# Patient Record
Sex: Male | Born: 1960 | Race: White | Hispanic: No | Marital: Single | Smoking: Never smoker
Health system: Southern US, Community
[De-identification: ages and names within clinical notes are randomized; demographics above are authoritative.]

## PROBLEM LIST (undated history)

## (undated) DIAGNOSIS — R569 Unspecified convulsions: Secondary | ICD-10-CM

## (undated) DIAGNOSIS — C801 Malignant (primary) neoplasm, unspecified: Secondary | ICD-10-CM

## (undated) DIAGNOSIS — J302 Other seasonal allergic rhinitis: Secondary | ICD-10-CM

## (undated) DIAGNOSIS — I10 Essential (primary) hypertension: Secondary | ICD-10-CM

## (undated) DIAGNOSIS — K219 Gastro-esophageal reflux disease without esophagitis: Secondary | ICD-10-CM

## (undated) DIAGNOSIS — IMO0001 Reserved for inherently not codable concepts without codable children: Secondary | ICD-10-CM

## (undated) DIAGNOSIS — M1990 Unspecified osteoarthritis, unspecified site: Secondary | ICD-10-CM

## (undated) DIAGNOSIS — J45909 Unspecified asthma, uncomplicated: Secondary | ICD-10-CM

## (undated) DIAGNOSIS — F419 Anxiety disorder, unspecified: Secondary | ICD-10-CM

## (undated) HISTORY — PX: CARPAL TUNNEL RELEASE: SHX101

## (undated) HISTORY — PX: COLONOSCOPY W/ POLYPECTOMY: SHX1380

---

## 1999-02-21 ENCOUNTER — Emergency Department (HOSPITAL_COMMUNITY): Admission: EM | Admit: 1999-02-21 | Discharge: 1999-02-21 | Payer: Self-pay | Admitting: Emergency Medicine

## 1999-02-21 ENCOUNTER — Encounter: Payer: Self-pay | Admitting: Emergency Medicine

## 2001-11-24 ENCOUNTER — Ambulatory Visit (HOSPITAL_BASED_OUTPATIENT_CLINIC_OR_DEPARTMENT_OTHER): Admission: RE | Admit: 2001-11-24 | Discharge: 2001-11-24 | Payer: Self-pay | Admitting: *Deleted

## 2014-08-09 ENCOUNTER — Encounter (HOSPITAL_COMMUNITY): Payer: Self-pay | Admitting: Emergency Medicine

## 2014-08-09 ENCOUNTER — Emergency Department (HOSPITAL_COMMUNITY)
Admission: EM | Admit: 2014-08-09 | Discharge: 2014-08-09 | Disposition: A | Discharge status: Home / Self Care | Payer: BLUE CROSS/BLUE SHIELD | Attending: Emergency Medicine | Admitting: Emergency Medicine

## 2014-08-09 ENCOUNTER — Emergency Department (HOSPITAL_COMMUNITY): Payer: BLUE CROSS/BLUE SHIELD

## 2014-08-09 DIAGNOSIS — Y9289 Other specified places as the place of occurrence of the external cause: Secondary | ICD-10-CM | POA: Diagnosis not present

## 2014-08-09 DIAGNOSIS — Z79899 Other long term (current) drug therapy: Secondary | ICD-10-CM | POA: Insufficient documentation

## 2014-08-09 DIAGNOSIS — Z7982 Long term (current) use of aspirin: Secondary | ICD-10-CM | POA: Diagnosis not present

## 2014-08-09 DIAGNOSIS — Y9389 Activity, other specified: Secondary | ICD-10-CM | POA: Diagnosis not present

## 2014-08-09 DIAGNOSIS — W108XXA Fall (on) (from) other stairs and steps, initial encounter: Secondary | ICD-10-CM | POA: Diagnosis not present

## 2014-08-09 DIAGNOSIS — S99912A Unspecified injury of left ankle, initial encounter: Secondary | ICD-10-CM | POA: Diagnosis present

## 2014-08-09 DIAGNOSIS — S82852A Displaced trimalleolar fracture of left lower leg, initial encounter for closed fracture: Secondary | ICD-10-CM | POA: Diagnosis not present

## 2014-08-09 DIAGNOSIS — Y998 Other external cause status: Secondary | ICD-10-CM | POA: Diagnosis not present

## 2014-08-09 DIAGNOSIS — G40909 Epilepsy, unspecified, not intractable, without status epilepticus: Secondary | ICD-10-CM | POA: Diagnosis not present

## 2014-08-09 DIAGNOSIS — J45909 Unspecified asthma, uncomplicated: Secondary | ICD-10-CM | POA: Diagnosis not present

## 2014-08-09 HISTORY — DX: Unspecified asthma, uncomplicated: J45.909

## 2014-08-09 HISTORY — DX: Unspecified convulsions: R56.9

## 2014-08-09 MED ORDER — HYDROCODONE-ACETAMINOPHEN 5-325 MG PO TABS: 1.0000 | ORAL_TABLET | Freq: Four times a day (QID) | ORAL | Status: DC | PRN

## 2014-08-09 NOTE — ED Provider Notes (Addendum)
CSN: [id number]     Arrival date & time 08/09/14  1443 History   First MD Initiated Contact with Patient 08/09/14 1504     Chief Complaint  Patient presents with  . Fall  . Ankle Injury    Left     (Consider location/radiation/quality/duration/timing/severity/associated sxs/prior Treatment) Patient is a 54 y.o. male presenting with fall and lower extremity injury. The history is provided by the patient.  Fall This is a new (walking down stairs with a table leaf and missed a step falling down 4 steps) problem. The current episode started 1 to 2 hours ago. The problem occurs constantly. The problem has not changed since onset.Associated symptoms comments: No head injury, LOC, back pain or abd pain.  Severe left ankle pain and deformity. The symptoms are aggravated by bending, twisting and standing. The symptoms are relieved by ice and rest. He has tried rest for the symptoms. The treatment provided no relief.  Ankle Injury    Past Medical History  Diagnosis Date  . Seizures   . Asthma    Past Surgical History  Procedure Laterality Date  . Carpal tunnel release     History reviewed. No pertinent family history. History  Substance Use Topics  . Smoking status: Not on file  . Smokeless tobacco: Not on file  . Alcohol Use: Not on file    Review of Systems  Neurological: Negative for weakness and numbness.  All other systems reviewed and are negative.     Allergies  Prednisone  Home Medications   Prior to Admission medications   Medication Sig Start Date End Date Taking? Authorizing Provider  aspirin 325 MG tablet Take 325 mg by mouth daily as needed for mild pain.   Yes Historical Provider, MD  clonazePAM (KLONOPIN) 1 MG tablet Take 1 mg by mouth at bedtime. 07/05/14  Yes Historical Provider, MD  ibuprofen (ADVIL,MOTRIN) 200 MG tablet Take 200 mg by mouth every 6 (six) hours as needed (pain.).   Yes Historical Provider, MD  lamoTRIgine (LAMICTAL) 100 MG tablet Take 100  mg by mouth daily. 06/07/14  Yes Historical Provider, MD  PROAIR HFA 108 (90 BASE) MCG/ACT inhaler Take 1 puff by mouth 2 (two) times daily as needed for wheezing or shortness of breath.  07/09/14  Yes Historical Provider, MD  traZODone (DESYREL) 150 MG tablet Take 225 mg by mouth at bedtime. 06/20/14  Yes Historical Provider, MD   BP 175/86 mmHg  Pulse 98  Temp(Src) 98.6 F (37 C) (Oral)  Resp 18  SpO2 97% Physical Exam  Constitutional: He is oriented to person, place, and time. He appears well-developed and well-nourished. No distress.  HENT:  Head: Normocephalic and atraumatic.  Mouth/Throat: Oropharynx is clear and moist.  Eyes: Conjunctivae and EOM are normal. Pupils are equal, round, and reactive to light.  Neck: Normal range of motion. Neck supple.  Cardiovascular: Normal rate and intact distal pulses.   Pulmonary/Chest: Effort normal.  Musculoskeletal: He exhibits no edema.       Left ankle: He exhibits decreased range of motion, swelling, ecchymosis and deformity. Tenderness. Lateral malleolus and medial malleolus tenderness found. No head of 5th metatarsal and no proximal fibula tenderness found. Achilles tendon normal.  Neurological: He is alert and oriented to person, place, and time.  Skin: Skin is warm and dry. No rash noted. No erythema.  Psychiatric: He has a normal mood and affect. His behavior is normal.  Nursing note and vitals reviewed.   ED Course  Procedures (  including critical care time) Labs Review Labs Reviewed - No data to display  Imaging Review Dg Ankle Complete Left  08/09/2014   CLINICAL DATA:  Left ankle pain after a fall down steps today. Swelling. Initial encounter.  EXAM: LEFT ANKLE COMPLETE - 3+ VIEW  COMPARISON:  None.  FINDINGS: The patient has medial and posterior malleolar fractures. As fracture of the distal fibula extends from the lateral malleolus 6.2 cm cephalad into the diaphysis. The medial clear space is widened. Marked soft tissue swelling  is present about the ankle.  IMPRESSION: Acute trimalleolar fractures as described   Electronically Signed   By: Inge Rise M.D.   On: 08/09/2014 15:50     EKG Interpretation None      MDM   Final diagnoses:  Trimalleolar fracture, left, closed, initial encounter    Patient with a mechanical fall down the steps to takes no anticoagulation presents with a left ankle deformity and pain. No other injuries from the fall. Patient has normal sensation and pulse in the left foot. X-rays show an acute trimalleolar fracture.  Spoke with Dr. Erlinda Hong with [facility] orthopedics patient will follow-up tomorrow. He also requested CT. He was placed in a splint and put on crutches nonweightbearing    Blanchie Dessert, MD 08/09/14 Robertson, MD 08/09/14 1658

## 2014-08-09 NOTE — Discharge Instructions (Signed)
Ankle Fracture  A fracture is a break in a bone. The ankle joint is made up of three bones. These include the lower (distal)sections of your lower leg bones, called the tibia and fibula, along with a bone in your foot, called the talus. Depending on how bad the break is and if more than one ankle joint bone is broken, a cast or splint is used to protect and keep your injured bone from moving while it heals. Sometimes, surgery is required to help the fracture heal properly.   There are two general types of fractures:   Stable fracture. This includes a single fracture line through one bone, with no injury to ankle ligaments. A fracture of the talus that does not have any displacement (movement of the bone on either side of the fracture line) is also stable.   Unstable fracture. This includes more than one fracture line through one or more bones in the ankle joint. It also includes fractures that have displacement of the bone on either side of the fracture line.  CAUSES   A direct blow to the ankle.    Quickly and severely twisting your ankle.   Trauma, such as a car accident or falling from a significant height.  RISK FACTORS  You may be at a higher risk of ankle fracture if:   You have certain medical conditions.   You are involved in high-impact sports.   You are involved in a high-impact car accident.  SIGNS AND SYMPTOMS    Tender and swollen ankle.   Bruising around the injured ankle.   Pain on movement of the ankle.   Difficulty walking or putting weight on the ankle.   A cold foot below the site of the ankle injury. This can occur if the blood vessels passing through your injured ankle were also damaged.   Numbness in the foot below the site of the ankle injury.  DIAGNOSIS   An ankle fracture is usually diagnosed with a physical exam and X-rays. A CT scan may also be required for complex fractures.  TREATMENT   Stable fractures are treated with a cast or splint and using crutches to avoid putting  weight on your injured ankle. This is followed by an ankle strengthening program. Some patients require a special type of cast, depending on other medical problems they may have. Unstable fractures require surgery to ensure the bones heal properly. Your health care provider will tell you what type of fracture you have and the best treatment for your condition.  HOME CARE INSTRUCTIONS    Review correct crutch use with your health care provider and use your crutches as directed. Safe use of crutches is extremely important. Misuse of crutches can cause you to fall or cause injury to nerves in your hands or armpits.   Do not put weight or pressure on the injured ankle until directed by your health care provider.   To lessen the swelling, keep the injured leg elevated while sitting or lying down.   Apply ice to the injured area:   Put ice in a plastic bag.   Place a towel between your cast and the bag.   Leave the ice on for 20 minutes, 2-3 times a day.   If you have a plaster or fiberglass cast:   Do not try to scratch the skin under the cast with any objects. This can increase your risk of skin infection.   Check the skin around the cast every day. You   may put lotion on any red or sore areas.   Keep your cast dry and clean.   If you have a plaster splint:   Wear the splint as directed.   You may loosen the elastic around the splint if your toes become numb, tingle, or turn cold or blue.   Do not put pressure on any part of your cast or splint; it may break. Rest your cast only on a pillow the first 24 hours until it is fully hardened.   Your cast or splint can be protected during bathing with a plastic bag sealed to your skin with medical tape. Do not lower the cast or splint into water.   Take medicines as directed by your health care provider. Only take over-the-counter or prescription medicines for pain, discomfort, or fever as directed by your health care provider.   Do not drive a vehicle until  your health care provider specifically tells you it is safe to do so.   If your health care provider has given you a follow-up appointment, it is very important to keep that appointment. Not keeping the appointment could result in a chronic or permanent injury, pain, and disability. If you have any problem keeping the appointment, call the facility for assistance.  SEEK MEDICAL CARE IF:  You develop increased swelling or discomfort.  SEEK IMMEDIATE MEDICAL CARE IF:    Your cast gets damaged or breaks.   You have continued severe pain.   You develop new pain or swelling after the cast was put on.   Your skin or toenails below the injury turn blue or gray.   Your skin or toenails below the injury feel cold, numb, or have loss of sensitivity to touch.   There is a bad smell or pus draining from under the cast.  MAKE SURE YOU:    Understand these instructions.   Will watch your condition.   Will get help right away if you are not doing well or get worse.  Document Released: 05/08/2000 Document Revised: 05/16/2013 Document Reviewed: 12/08/2012  [employer] Patient Information 2015 [employer]. This information is not intended to replace advice given to you by your health care provider. Make sure you discuss any questions you have with your health care provider.

## 2014-08-09 NOTE — ED Notes (Signed)
Pt carrying large item down stairs, slipped around 4 steps up from the floor. Fell directly onto left ankle, per EMS large amount of bruising and swelling, no open wounds, possible deformity visualized.

## 2014-08-09 NOTE — ED Notes (Signed)
Bed: FV49 Expected date:  Expected time:  Means of arrival:  Comments: Fall, ankle deformity

## 2014-08-10 ENCOUNTER — Encounter (HOSPITAL_COMMUNITY): Payer: Self-pay | Admitting: *Deleted

## 2014-08-10 ENCOUNTER — Other Ambulatory Visit (HOSPITAL_COMMUNITY): Payer: Self-pay | Admitting: Orthopaedic Surgery

## 2014-08-10 NOTE — Progress Notes (Signed)
Mr Cadieux stated that Dr Erlinda Hong said he could eat a bandana and drink something before 0700, NPO after that.

## 2014-08-12 MED ORDER — CEFAZOLIN SODIUM-DEXTROSE 2-3 GM-% IV SOLR: 2.0000 g | INTRAVENOUS | Status: AC

## 2014-08-13 ENCOUNTER — Ambulatory Visit (HOSPITAL_COMMUNITY)
Admission: RE | Admit: 2014-08-13 | Discharge: 2014-08-13 | Disposition: A | Discharge status: Home / Self Care | Payer: BLUE CROSS/BLUE SHIELD | Source: Ambulatory Visit | Attending: Orthopaedic Surgery | Admitting: Orthopaedic Surgery

## 2014-08-13 ENCOUNTER — Ambulatory Visit (HOSPITAL_COMMUNITY): Payer: BLUE CROSS/BLUE SHIELD

## 2014-08-13 ENCOUNTER — Ambulatory Visit (HOSPITAL_COMMUNITY): Payer: BLUE CROSS/BLUE SHIELD | Admitting: Anesthesiology

## 2014-08-13 ENCOUNTER — Encounter (HOSPITAL_COMMUNITY)
Admission: RE | Disposition: A | Discharge status: Home / Self Care | Payer: Self-pay | Source: Ambulatory Visit | Attending: Orthopaedic Surgery

## 2014-08-13 ENCOUNTER — Encounter (HOSPITAL_COMMUNITY): Payer: Self-pay | Admitting: *Deleted

## 2014-08-13 ENCOUNTER — Other Ambulatory Visit: Payer: Self-pay

## 2014-08-13 DIAGNOSIS — Z6838 Body mass index (BMI) 38.0-38.9, adult: Secondary | ICD-10-CM | POA: Diagnosis not present

## 2014-08-13 DIAGNOSIS — Z85828 Personal history of other malignant neoplasm of skin: Secondary | ICD-10-CM | POA: Diagnosis not present

## 2014-08-13 DIAGNOSIS — S82852A Displaced trimalleolar fracture of left lower leg, initial encounter for closed fracture: Secondary | ICD-10-CM | POA: Insufficient documentation

## 2014-08-13 DIAGNOSIS — X58XXXA Exposure to other specified factors, initial encounter: Secondary | ICD-10-CM | POA: Diagnosis not present

## 2014-08-13 DIAGNOSIS — Z888 Allergy status to other drugs, medicaments and biological substances status: Secondary | ICD-10-CM | POA: Insufficient documentation

## 2014-08-13 DIAGNOSIS — F1099 Alcohol use, unspecified with unspecified alcohol-induced disorder: Secondary | ICD-10-CM | POA: Insufficient documentation

## 2014-08-13 DIAGNOSIS — Z7982 Long term (current) use of aspirin: Secondary | ICD-10-CM | POA: Diagnosis not present

## 2014-08-13 DIAGNOSIS — F419 Anxiety disorder, unspecified: Secondary | ICD-10-CM | POA: Diagnosis not present

## 2014-08-13 DIAGNOSIS — Z419 Encounter for procedure for purposes other than remedying health state, unspecified: Secondary | ICD-10-CM

## 2014-08-13 DIAGNOSIS — F1210 Cannabis abuse, uncomplicated: Secondary | ICD-10-CM | POA: Insufficient documentation

## 2014-08-13 DIAGNOSIS — K219 Gastro-esophageal reflux disease without esophagitis: Secondary | ICD-10-CM | POA: Diagnosis not present

## 2014-08-13 DIAGNOSIS — J45909 Unspecified asthma, uncomplicated: Secondary | ICD-10-CM | POA: Insufficient documentation

## 2014-08-13 HISTORY — DX: Reserved for inherently not codable concepts without codable children: IMO0001

## 2014-08-13 HISTORY — PX: ORIF ANKLE FRACTURE: SHX5408

## 2014-08-13 HISTORY — DX: Other seasonal allergic rhinitis: J30.2

## 2014-08-13 HISTORY — DX: Anxiety disorder, unspecified: F41.9

## 2014-08-13 HISTORY — DX: Malignant (primary) neoplasm, unspecified: C80.1

## 2014-08-13 HISTORY — DX: Gastro-esophageal reflux disease without esophagitis: K21.9

## 2014-08-13 HISTORY — DX: Unspecified osteoarthritis, unspecified site: M19.90

## 2014-08-13 LAB — CBC
HCT: 43.7 % (ref 39.0–52.0)
Hemoglobin: 15 g/dL (ref 13.0–17.0)
MCH: 31.8 pg (ref 26.0–34.0)
MCHC: 34.3 g/dL (ref 30.0–36.0)
MCV: 92.6 fL (ref 78.0–100.0)
Platelets: 230 10*3/uL (ref 150–400)
RBC: 4.72 MIL/uL (ref 4.22–5.81)
RDW: 13 % (ref 11.5–15.5)
WBC: 8.3 10*3/uL (ref 4.0–10.5)

## 2014-08-13 LAB — BASIC METABOLIC PANEL
Anion gap: 10 (ref 5–15)
BUN: 11 mg/dL (ref 6–23)
CO2: 27 mmol/L (ref 19–32)
Calcium: 9.2 mg/dL (ref 8.4–10.5)
Chloride: 102 mmol/L (ref 96–112)
Creatinine, Ser: 0.81 mg/dL (ref 0.50–1.35)
GFR calc Af Amer: >90 mL/min (ref >90)
GFR calc non Af Amer: >90 mL/min (ref >90)
Glucose, Bld: 100 mg/dL — ABNORMAL HIGH (ref 70–99)
Potassium: 3.9 mmol/L (ref 3.5–5.1)
Sodium: 139 mmol/L (ref 135–145)

## 2014-08-13 LAB — SURGICAL PCR SCREEN
MRSA, PCR: NEGATIVE
Staphylococcus aureus: NEGATIVE

## 2014-08-13 SURGERY — OPEN REDUCTION INTERNAL FIXATION (ORIF) ANKLE FRACTURE
Anesthesia: General | Site: Ankle | Laterality: Left

## 2014-08-13 MED ORDER — PROPOFOL 10 MG/ML IV BOLUS
INTRAVENOUS | Status: AC
  Filled 2014-08-13: qty 20

## 2014-08-13 MED ORDER — LACTATED RINGERS IV SOLN
INTRAVENOUS | Status: DC
  Administered 2014-08-13: 13:00:00 via INTRAVENOUS

## 2014-08-13 MED ORDER — OXYCODONE HCL 5 MG/5ML PO SOLN: 5.0000 mg | Freq: Once | ORAL | Status: DC | PRN

## 2014-08-13 MED ORDER — LACTATED RINGERS IV SOLN
INTRAVENOUS | Status: DC | PRN
  Administered 2014-08-13 (×2): via INTRAVENOUS

## 2014-08-13 MED ORDER — CEFAZOLIN SODIUM-DEXTROSE 2-3 GM-% IV SOLR
INTRAVENOUS | Status: AC
  Administered 2014-08-13: 2 g via INTRAVENOUS
  Filled 2014-08-13: qty 50

## 2014-08-13 MED ORDER — FENTANYL CITRATE 0.05 MG/ML IJ SOLN
INTRAMUSCULAR | Status: AC
  Filled 2014-08-13: qty 5

## 2014-08-13 MED ORDER — MIDAZOLAM HCL 2 MG/2ML IJ SOLN
INTRAMUSCULAR | Status: AC
  Filled 2014-08-13: qty 2

## 2014-08-13 MED ORDER — FENTANYL CITRATE 0.05 MG/ML IJ SOLN
INTRAMUSCULAR | Status: DC | PRN
  Administered 2014-08-13 (×2): 50 ug via INTRAVENOUS
  Administered 2014-08-13: 100 ug via INTRAVENOUS
  Administered 2014-08-13: 50 ug via INTRAVENOUS
  Administered 2014-08-13: 100 ug via INTRAVENOUS

## 2014-08-13 MED ORDER — MUPIROCIN 2 % EX OINT
1.0000 "application " | TOPICAL_OINTMENT | Freq: Once | CUTANEOUS | Status: AC
  Administered 2014-08-13: 1 via TOPICAL
  Filled 2014-08-13: qty 22

## 2014-08-13 MED ORDER — PROPOFOL 10 MG/ML IV BOLUS
INTRAVENOUS | Status: DC | PRN
  Administered 2014-08-13: 200 mg via INTRAVENOUS
  Administered 2014-08-13: 90 mg via INTRAVENOUS

## 2014-08-13 MED ORDER — PROMETHAZINE HCL 25 MG/ML IJ SOLN: 6.2500 mg | INTRAMUSCULAR | Status: DC | PRN

## 2014-08-13 MED ORDER — OXYCODONE HCL 5 MG PO TABS: 5.0000 mg | ORAL_TABLET | Freq: Once | ORAL | Status: DC | PRN

## 2014-08-13 MED ORDER — MIDAZOLAM HCL 5 MG/5ML IJ SOLN
INTRAMUSCULAR | Status: DC | PRN
  Administered 2014-08-13: 2 mg via INTRAVENOUS

## 2014-08-13 MED ORDER — HYDROMORPHONE HCL 1 MG/ML IJ SOLN: 0.2500 mg | INTRAMUSCULAR | Status: DC | PRN

## 2014-08-13 MED ORDER — 0.9 % SODIUM CHLORIDE (POUR BTL) OPTIME
TOPICAL | Status: DC | PRN
  Administered 2014-08-13: 1000 mL

## 2014-08-13 MED ORDER — LIDOCAINE HCL (CARDIAC) 20 MG/ML IV SOLN
INTRAVENOUS | Status: DC | PRN
  Administered 2014-08-13: 100 mg via INTRAVENOUS

## 2014-08-13 MED ORDER — OXYCODONE HCL 5 MG PO TABS: 5.0000 mg | ORAL_TABLET | ORAL | Status: DC | PRN

## 2014-08-13 MED ORDER — BUPIVACAINE-EPINEPHRINE (PF) 0.5% -1:200000 IJ SOLN
INTRAMUSCULAR | Status: DC | PRN
  Administered 2014-08-13: 30 mL via PERINEURAL

## 2014-08-13 MED ORDER — LACTATED RINGERS IV SOLN: INTRAVENOUS | Status: DC

## 2014-08-13 MED ORDER — FENTANYL CITRATE 0.05 MG/ML IJ SOLN
INTRAMUSCULAR | Status: AC
  Filled 2014-08-13: qty 2

## 2014-08-13 MED ORDER — ONDANSETRON HCL 4 MG/2ML IJ SOLN
INTRAMUSCULAR | Status: DC | PRN
  Administered 2014-08-13: 4 mg via INTRAVENOUS

## 2014-08-13 MED ORDER — ASPIRIN EC 325 MG PO TBEC: 325.0000 mg | DELAYED_RELEASE_TABLET | Freq: Two times a day (BID) | ORAL | Status: DC

## 2014-08-13 SURGICAL SUPPLY — 77 items
BANDAGE ELASTIC 4 VELCRO ST LF (GAUZE/BANDAGES/DRESSINGS) ×2 IMPLANT
BANDAGE ELASTIC 6 VELCRO ST LF (GAUZE/BANDAGES/DRESSINGS) ×2 IMPLANT
BANDAGE ESMARK 6X9 LF (GAUZE/BANDAGES/DRESSINGS) ×1 IMPLANT
BIT DRILL CANN 2.7 (BIT) ×2
BIT DRILL CANN 2.7MM (BIT) ×1
BIT DRILL SRG 2.7XCANN AO CPLG (BIT) IMPLANT
BIT DRL SRG 2.7XCANN AO CPLNG (BIT) ×1
BNDG CMPR 9X6 STRL LF SNTH (GAUZE/BANDAGES/DRESSINGS) ×1
BNDG COHESIVE 4X5 TAN STRL (GAUZE/BANDAGES/DRESSINGS) ×3 IMPLANT
BNDG COHESIVE 6X5 TAN STRL LF (GAUZE/BANDAGES/DRESSINGS) ×3 IMPLANT
BNDG ESMARK 6X9 LF (GAUZE/BANDAGES/DRESSINGS) ×3
CANISTER SUCT 3000ML PPV (MISCELLANEOUS) ×3 IMPLANT
COUNTERSINK (MISCELLANEOUS) ×3
COVER SURGICAL LIGHT HANDLE (MISCELLANEOUS) ×3 IMPLANT
CUFF TOURNIQUET SINGLE 34IN LL (TOURNIQUET CUFF) ×2 IMPLANT
CUFF TOURNIQUET SINGLE 44IN (TOURNIQUET CUFF) IMPLANT
DRAPE C-ARM 42X72 X-RAY (DRAPES) ×3 IMPLANT
DRAPE C-ARMOR (DRAPES) ×3 IMPLANT
DRAPE IMP U-DRAPE 54X76 (DRAPES) ×3 IMPLANT
DRAPE INCISE IOBAN 66X45 STRL (DRAPES) ×3 IMPLANT
DRAPE U-SHAPE 47X51 STRL (DRAPES) ×4 IMPLANT
DRILL 2.6X122MM WL AO SHAFT (BIT) ×2 IMPLANT
DURAPREP 26ML APPLICATOR (WOUND CARE) ×5 IMPLANT
ELECT CAUTERY BLADE 6.4 (BLADE) ×3 IMPLANT
ELECT REM PT RETURN 9FT ADLT (ELECTROSURGICAL) ×3
ELECTRODE REM PT RTRN 9FT ADLT (ELECTROSURGICAL) ×1 IMPLANT
FACESHIELD WRAPAROUND (MASK) ×3 IMPLANT
FACESHIELD WRAPAROUND OR TEAM (MASK) ×1 IMPLANT
GAUZE SPONGE 4X4 12PLY STRL (GAUZE/BANDAGES/DRESSINGS) ×3 IMPLANT
GAUZE XEROFORM 5X9 LF (GAUZE/BANDAGES/DRESSINGS) ×3 IMPLANT
GLOVE NEODERM STRL 7.5 LF PF (GLOVE) ×1 IMPLANT
GLOVE SURG NEODERM 7.5  LF PF (GLOVE) ×2
GLOVE SURG SYN 7.5  E (GLOVE) ×2
GLOVE SURG SYN 7.5 E (GLOVE) ×1 IMPLANT
GLOVE SURG SYN 7.5 PF PI (GLOVE) ×2 IMPLANT
GOWN STRL REIN XL XLG (GOWN DISPOSABLE) ×3 IMPLANT
K-WIRE ORTHOPEDIC 1.4X150L (WIRE) ×6
KIT BASIN OR (CUSTOM PROCEDURE TRAY) ×3 IMPLANT
KIT ROOM TURNOVER OR (KITS) ×3 IMPLANT
KWIRE ORTHOPEDIC 1.4X150L (WIRE) IMPLANT
NDL HYPO 25GX1X1/2 BEV (NEEDLE) IMPLANT
NEEDLE HYPO 25GX1X1/2 BEV (NEEDLE) IMPLANT
NS IRRIG 1000ML POUR BTL (IV SOLUTION) ×3 IMPLANT
PACK ORTHO EXTREMITY (CUSTOM PROCEDURE TRAY) ×3 IMPLANT
PAD ABD 8X10 STRL (GAUZE/BANDAGES/DRESSINGS) ×2 IMPLANT
PAD ARMBOARD 7.5X6 YLW CONV (MISCELLANEOUS) ×6 IMPLANT
PAD CAST 3X4 CTTN HI CHSV (CAST SUPPLIES) ×2 IMPLANT
PADDING CAST COTTON 3X4 STRL (CAST SUPPLIES)
PADDING CAST COTTON 6X4 STRL (CAST SUPPLIES) ×3 IMPLANT
PLATE FIBULA 5H (Plate) ×2 IMPLANT
SCREW BONE 14MMX3.5MM (Screw) ×2 IMPLANT
SCREW BONE 18 (Screw) ×2 IMPLANT
SCREW BONE 3.5X16MM (Screw) ×2 IMPLANT
SCREW BONE 3.5X20MM (Screw) ×2 IMPLANT
SCREW BONE 3.5X40 (Screw) ×2 IMPLANT
SCREW BONE NON-LCKING 3.5X12MM (Screw) ×2 IMPLANT
SCREW CANNULATED 4.0X36MM FT (Screw) ×4 IMPLANT
SCREW CANNULATED 4.0X36MM PT (Screw) ×2 IMPLANT
SCREW COUNTERSINK (MISCELLANEOUS) IMPLANT
SCREW LOCKING 3.5X16MM (Screw) ×4 IMPLANT
SCREW LOCKING 3.5X18MM (Screw) ×4 IMPLANT
SCREW NONLOCK 22MM (Screw) ×2 IMPLANT
SPLINT PLASTER CAST XFAST 5X30 (CAST SUPPLIES) IMPLANT
SPLINT PLASTER XFAST SET 5X30 (CAST SUPPLIES) ×2
SPONGE LAP 18X18 X RAY DECT (DISPOSABLE) IMPLANT
SUCTION FRAZIER TIP 10 FR DISP (SUCTIONS) ×3 IMPLANT
SUT ETHILON 3 0 PS 1 (SUTURE) ×4 IMPLANT
SUT VIC AB 0 CT1 27 (SUTURE) ×3
SUT VIC AB 0 CT1 27XBRD ANBCTR (SUTURE) IMPLANT
SUT VIC AB 2-0 CT1 27 (SUTURE) ×3
SUT VIC AB 2-0 CT1 TAPERPNT 27 (SUTURE) IMPLANT
SYR CONTROL 10ML LL (SYRINGE) IMPLANT
TOWEL OR 17X24 6PK STRL BLUE (TOWEL DISPOSABLE) ×3 IMPLANT
TOWEL OR 17X26 10 PK STRL BLUE (TOWEL DISPOSABLE) ×4 IMPLANT
TUBE CONNECTING 12'X1/4 (SUCTIONS) ×1
TUBE CONNECTING 12X1/4 (SUCTIONS) ×2 IMPLANT
WATER STERILE IRR 1000ML POUR (IV SOLUTION) ×1 IMPLANT

## 2014-08-13 NOTE — Anesthesia Procedure Notes (Addendum)
Anesthesia Regional Block:  Popliteal block  Pre-Anesthetic Checklist: ,, timeout performed, Correct Patient, Correct Site, Correct Laterality, Correct Procedure, Correct Position, site marked, Risks and benefits discussed,  Surgical consent,  Pre-op evaluation,  At surgeon's request and post-op pain management  Laterality: Left and Lower  Prep: chloraprep       Needles:   Needle Type: Echogenic Needle     Needle Length: 9cm 9 cm Needle Gauge: 21 and 21 G  Needle insertion depth: 4 cm   Additional Needles:  Procedures: ultrasound guided (picture in chart) and nerve stimulator Popliteal block Narrative:  Start time: 08/13/2014 2:20 PM End time: 08/13/2014 2:35 PM  Performed by: Personally  Anesthesiologist: MASSAGEE, TERRY  Additional Notes: Tolerated well   Procedure Name: LMA Insertion Date/Time: 08/13/2014 3:01 PM Performed by: Izora Gala Pre-anesthesia Checklist: Patient identified, Emergency Drugs available, Suction available and Patient being monitored Patient Re-evaluated:Patient Re-evaluated prior to inductionOxygen Delivery Method: Circle system utilized Preoxygenation: Pre-oxygenation with 100% oxygen Intubation Type: IV induction Ventilation: Mask ventilation without difficulty LMA: LMA inserted LMA Size: 5.0 Tube type: Oral Number of attempts: 1 Placement Confirmation: positive ETCO2 Tube secured with: Tape Dental Injury: Teeth and Oropharynx as per pre-operative assessment

## 2014-08-13 NOTE — H&P (Signed)
PREOPERATIVE H&P  Chief Complaint: Left trimalleolar ankle fracture  HPI: Thomas Walters Thomas Walters is a 54 y.o. male who presents for surgical treatment of Left trimalleolar ankle fracture.  He denies any changes in medical history.  Past Medical History  Diagnosis Date  . Asthma   . Seizures     non since 1999  . Shortness of breath dyspnea     with exertion, weather changes  . Seasonal allergies   . Anxiety   . GERD (gastroesophageal reflux disease)   . Arthritis   . Cancer     Skin cancer- basal- forearm, upper chest   Past Surgical History  Procedure Laterality Date  . Carpal tunnel release Right   . Colonoscopy w/ polypectomy     History   Social History  . Marital Status: Single    Spouse Name: N/A  . Number of Children: N/A  . Years of Education: N/A   Social History Main Topics  . Smoking status: Never Smoker   . Smokeless tobacco: Not on file  . Alcohol Use: 21.0 oz/week    35 Cans of beer per week     Comment: none wiith pain medication  . Drug Use: Yes    Special: Marijuana     Comment: " last time 08/08/14  . Sexual Activity: Not on file   Other Topics Concern  . Not on file   Social History Narrative   No family history on file. Allergies  Allergen Reactions  . Prednisone Anxiety    Can't sleep   Prior to Admission medications   Medication Sig Start Date End Date Taking? Authorizing Provider  aspirin 325 MG tablet Take 325 mg by mouth daily as needed for mild pain.   Yes Historical Provider, MD  clonazePAM (KLONOPIN) 1 MG tablet Take 1 mg by mouth at bedtime. 07/05/14  Yes Historical Provider, MD  HYDROcodone-acetaminophen (NORCO/VICODIN) 5-325 MG per tablet Take 1-2 tablets by mouth every 6 (six) hours as needed. 08/09/14  Yes Blanchie Dessert, MD  ibuprofen (ADVIL,MOTRIN) 200 MG tablet Take 200 mg by mouth every 6 (six) hours as needed (pain.).   Yes Historical Provider, MD  lamoTRIgine (LAMICTAL) 100 MG tablet Take 100 mg by mouth daily. 06/07/14   Yes Historical Provider, MD  Multiple Vitamin (MULTIVITAMIN) tablet Take 1 tablet by mouth daily.   Yes Historical Provider, MD  oxycodone (OXY-IR) 5 MG capsule Take 10 mg by mouth every 4 (four) hours as needed.   Yes Historical Provider, MD  PROAIR HFA 108 (90 BASE) MCG/ACT inhaler Take 1 puff by mouth 2 (two) times daily as needed for wheezing or shortness of breath.  07/09/14  Yes Historical Provider, MD  traZODone (DESYREL) 150 MG tablet Take 225 mg by mouth at bedtime. 06/20/14  Yes Historical Provider, MD     Positive ROS: All other systems have been reviewed and were otherwise negative with the exception of those mentioned in the HPI and as above.  Physical Exam: General: Alert, no acute distress Cardiovascular: No pedal edema Respiratory: No cyanosis, no use of accessory musculature GI: abdomen soft Skin: No lesions in the area of chief complaint Neurologic: Sensation intact distally Psychiatric: Patient is competent for consent with normal mood and affect Lymphatic: no lymphedema  MUSCULOSKELETAL: exam stable  Assessment: Left trimalleolar ankle fracture  Plan: Plan for Procedure(s): OPEN REDUCTION INTERNAL FIXATION (ORIF) LEFT ANKLE FRACTURE  The risks benefits and alternatives were discussed with the patient including but not limited to the risks of nonoperative  treatment, versus surgical intervention including infection, bleeding, nerve injury,  blood clots, cardiopulmonary complications, morbidity, mortality, among others, and they were willing to proceed.   Marianna Payment, MD   08/13/2014 7:35 AM

## 2014-08-13 NOTE — Transfer of Care (Signed)
Immediate Anesthesia Transfer of Care Note  Patient: Thomas Walters  Procedure(s) Performed: Procedure(s): OPEN REDUCTION INTERNAL FIXATION (ORIF) LEFT ANKLE FRACTURE (Left)  Patient Location: PACU  Anesthesia Type:General and Regional  Level of Consciousness: awake, alert , oriented and patient cooperative  Airway & Oxygen Therapy: Patient Spontanous Breathing and Patient connected to nasal cannula oxygen  Post-op Assessment: Report given to RN, Post -op Vital signs reviewed and stable and Patient moving all extremities  Post vital signs: Reviewed and stable  Last Vitals:  Filed Vitals:   08/13/14 1335  BP: 138/78  Pulse: 94  Temp:   Resp: 15    Complications: No apparent anesthesia complications

## 2014-08-13 NOTE — Discharge Instructions (Signed)
° ° °  1. Keep splint clean and dry °2. Elevate foot above level of the heart °3. Take aspirin to prevent blood clots °4. Take pain meds as needed °5. Strict non weight bearing to operative extremity ° °

## 2014-08-13 NOTE — Anesthesia Preprocedure Evaluation (Addendum)
Anesthesia Evaluation  Patient identified by MRN, date of birth, ID band Patient awake    Reviewed: Allergy & Precautions, NPO status , Patient's Chart, lab work & pertinent test results  History of Anesthesia Complications Negative for: history of anesthetic complications  Airway Mallampati: II  TM Distance: >3 FB Neck ROM: Full    Dental  (+) Teeth Intact   Pulmonary shortness of breath, asthma ,  breath sounds clear to auscultation        Cardiovascular negative cardio ROS  Rhythm:Regular Rate:Normal     Neuro/Psych Seizures -,     GI/Hepatic GERD-  ,  Endo/Other  Morbid obesity  Renal/GU      Musculoskeletal  (+) Arthritis -,   Abdominal (+) + obese,   Peds  Hematology   Anesthesia Other Findings   Reproductive/Obstetrics                            Anesthesia Physical Anesthesia Plan  ASA: III  Anesthesia Plan: General   Post-op Pain Management:    Induction: Intravenous  Airway Management Planned: Oral ETT and LMA  Additional Equipment:   Intra-op Plan:   Post-operative Plan: Extubation in OR  Informed Consent: I have reviewed the patients History and Physical, chart, labs and discussed the procedure including the risks, benefits and alternatives for the proposed anesthesia with the patient or authorized representative who has indicated his/her understanding and acceptance.   Dental advisory given  Plan Discussed with: CRNA and Surgeon  Anesthesia Plan Comments:         Anesthesia Quick Evaluation

## 2014-08-13 NOTE — Anesthesia Postprocedure Evaluation (Signed)
  Anesthesia Post-op Note  Patient: Thomas Walters  Procedure(s) Performed: Procedure(s): OPEN REDUCTION INTERNAL FIXATION (ORIF) LEFT ANKLE FRACTURE (Left)  Patient Location: PACU  Anesthesia Type:General  Level of Consciousness: awake, oriented, sedated and patient cooperative  Airway and Oxygen Therapy: Patient Spontanous Breathing  Post-op Pain: mild  Post-op Assessment: Post-op Vital signs reviewed, Patient's Cardiovascular Status Stable, Respiratory Function Stable, Patent Airway, No signs of Nausea or vomiting and Pain level controlled  Post-op Vital Signs: stable  Last Vitals:  Filed Vitals:   08/13/14 1659  BP:   Pulse:   Temp: 36.8 C  Resp:     Complications: No apparent anesthesia complications

## 2014-08-13 NOTE — Op Note (Signed)
Date of Surgery: 08/13/2014  INDICATIONS: Thomas Walters is a 54 y.o.-year-old male who sustained a left ankle fracture; he was indicated for open reduction and internal fixation due to the displaced nature of the articular fracture and came to the operating room today for this procedure. The patient did consent to the procedure after discussion of the risks and benefits.  PREOPERATIVE DIAGNOSIS: left trimalleolar ankle fracture with syndesmostic injury  POSTOPERATIVE DIAGNOSIS: Same.  PROCEDURE:  1. Open treatment of left ankle fracture with internal fixation. Trimalleolar w/o fixation of posterior malleolus CPT 27822.  2. Open treatment of left ankle syndesmosis with internal fixation  SURGEON: Thomas Walters, M.D.  ASSIST: Thomas Walters, RNFA.  ANESTHESIA:  general, regional  TOURNIQUET TIME: less than 90 minutes  IV FLUIDS AND URINE: See anesthesia.  ESTIMATED BLOOD LOSS: minimal mL.  IMPLANTS: [employer] Variax 5 hole distal fibula plate  COMPLICATIONS: None.  DESCRIPTION OF PROCEDURE: The patient was brought to the operating room and placed supine on the operating table.  The patient had been signed prior to the procedure and this was documented. The patient had the anesthesia placed by the anesthesiologist.  A nonsterile tourniquet was placed on the upper thigh.  The prep verification and incision time-outs were performed to confirm that this was the correct patient, site, side and location. The patient had an SCD on the opposite lower extremity. The patient did receive antibiotics prior to the incision and was re-dosed during the procedure as needed at indicated intervals.  The patient had the lower extremity prepped and draped in the standard surgical fashion.  The extremity was exsanguinated using an esmarch bandage and the tourniquet was inflated to 350 mm Hg.  A lateral incision over the fibula was created.  Blunt dissection taken down to the fascia.  Superficial peroneal nerve  was mobilized and retracted.  Fascia incised in line with the incision.  The peroneals were elevated off of the fibula to expose the fracture.  Organized hematoma was removed from the fracture site.  The fracture was reduced using manual reduction and held with a clamp.  Xrays confirmed reduction.  A 18 mm lag screw was placed across the fracture.  Clamp was removed and fracture stayed reduced.  A neutralization plate was placed on the lateral aspect of the fibula.  A series of locking and nonlocking screws were placed in the fibula through the plate.  We then turned our attention to the medial malleolus.  A longitudinal incision was made over the medial malleolus.  The saphenous structures were mobilized and retracted.  The fracture was exposed.  Entrapped periosteum and hematoma removed.  The joint was visualized and there was chondral injury to the medial dome of the talus.  There was also an intraarticular piece of bone and cartilage without soft tissue attachment.  This was removed.  We then reduced the fracture and placed 2 parallel 1.4 mm K wires across the fracture.  Two cannulated screws were placed across the fracture to gain compression.  After this, we found the medial clear space was still widened.  We reduced the syndesmosis with a king tong clamp and placed a tricortical nonlocking screw to fix the syndesmosis.  The screw was placed parallel to the joint.  Final xrays were taken.  Wounds thoroughly irrigated.  Closed in a layer fashion using 0 vicryl, 2.0 vicryl and 3.0 nylon.  Sterile dressings applied.  Short leg splint applied.  Patient was extubated and transferred to the PACU in  stable condition.  POSTOPERATIVE PLAN: Thomas Walters will remain nonweightbearing on this leg for approximately 6 weeks; Thomas Walters will return for suture removal in 2 weeks.  He will be immobilized in a short leg splint and then transitioned to a CAM walker at his first follow up appointment.  Thomas Walters will  receive DVT prophylaxis based on other medications, activity level, and risk ratio of bleeding to thrombosis.  Thomas Cecil, MD [facility] [phone] 4:30 PM

## 2014-08-14 ENCOUNTER — Encounter (HOSPITAL_COMMUNITY): Payer: Self-pay | Admitting: Orthopaedic Surgery

## 2015-02-05 ENCOUNTER — Other Ambulatory Visit (HOSPITAL_BASED_OUTPATIENT_CLINIC_OR_DEPARTMENT_OTHER): Payer: Self-pay | Admitting: Orthopaedic Surgery

## 2015-02-06 ENCOUNTER — Encounter (HOSPITAL_BASED_OUTPATIENT_CLINIC_OR_DEPARTMENT_OTHER): Payer: Self-pay | Admitting: *Deleted

## 2015-02-07 ENCOUNTER — Encounter (HOSPITAL_BASED_OUTPATIENT_CLINIC_OR_DEPARTMENT_OTHER)
Admission: RE | Admit: 2015-02-07 | Discharge: 2015-02-07 | Disposition: A | Discharge status: Home / Self Care | Payer: BLUE CROSS/BLUE SHIELD | Source: Ambulatory Visit | Attending: Orthopaedic Surgery | Admitting: Orthopaedic Surgery

## 2015-02-07 ENCOUNTER — Other Ambulatory Visit: Payer: Self-pay

## 2015-02-07 DIAGNOSIS — I10 Essential (primary) hypertension: Secondary | ICD-10-CM | POA: Diagnosis not present

## 2015-02-07 DIAGNOSIS — Z4589 Encounter for adjustment and management of other implanted devices: Secondary | ICD-10-CM | POA: Diagnosis not present

## 2015-02-07 DIAGNOSIS — Z7982 Long term (current) use of aspirin: Secondary | ICD-10-CM | POA: Diagnosis not present

## 2015-02-07 DIAGNOSIS — Z01818 Encounter for other preprocedural examination: Secondary | ICD-10-CM | POA: Diagnosis present

## 2015-02-07 LAB — BASIC METABOLIC PANEL
Anion gap: 7 (ref 5–15)
BUN: 11 mg/dL (ref 6–20)
CO2: 29 mmol/L (ref 22–32)
CREATININE: 0.85 mg/dL (ref 0.61–1.24)
Calcium: 9.7 mg/dL (ref 8.9–10.3)
Chloride: 102 mmol/L (ref 101–111)
GFR calc non Af Amer: >60 mL/min (ref >60)
Glucose, Bld: 99 mg/dL (ref 65–99)
POTASSIUM: 4.6 mmol/L (ref 3.5–5.1)
SODIUM: 138 mmol/L (ref 135–145)

## 2015-02-13 ENCOUNTER — Encounter (HOSPITAL_BASED_OUTPATIENT_CLINIC_OR_DEPARTMENT_OTHER)
Admission: RE | Disposition: A | Discharge status: Home / Self Care | Payer: Self-pay | Source: Ambulatory Visit | Attending: Orthopaedic Surgery

## 2015-02-13 ENCOUNTER — Ambulatory Visit (HOSPITAL_BASED_OUTPATIENT_CLINIC_OR_DEPARTMENT_OTHER): Payer: BLUE CROSS/BLUE SHIELD | Admitting: Certified Registered"

## 2015-02-13 ENCOUNTER — Ambulatory Visit (HOSPITAL_BASED_OUTPATIENT_CLINIC_OR_DEPARTMENT_OTHER)
Admission: RE | Admit: 2015-02-13 | Discharge: 2015-02-13 | Disposition: A | Discharge status: Home / Self Care | Payer: BLUE CROSS/BLUE SHIELD | Source: Ambulatory Visit | Attending: Orthopaedic Surgery | Admitting: Orthopaedic Surgery

## 2015-02-13 ENCOUNTER — Ambulatory Visit (HOSPITAL_COMMUNITY): Payer: BLUE CROSS/BLUE SHIELD

## 2015-02-13 ENCOUNTER — Encounter (HOSPITAL_BASED_OUTPATIENT_CLINIC_OR_DEPARTMENT_OTHER): Payer: Self-pay | Admitting: Certified Registered"

## 2015-02-13 DIAGNOSIS — Z4589 Encounter for adjustment and management of other implanted devices: Secondary | ICD-10-CM | POA: Diagnosis not present

## 2015-02-13 DIAGNOSIS — I10 Essential (primary) hypertension: Secondary | ICD-10-CM | POA: Insufficient documentation

## 2015-02-13 DIAGNOSIS — Z7982 Long term (current) use of aspirin: Secondary | ICD-10-CM | POA: Insufficient documentation

## 2015-02-13 DIAGNOSIS — Z472 Encounter for removal of internal fixation device: Secondary | ICD-10-CM

## 2015-02-13 HISTORY — PX: HARDWARE REMOVAL: SHX979

## 2015-02-13 HISTORY — DX: Essential (primary) hypertension: I10

## 2015-02-13 SURGERY — REMOVAL, HARDWARE
Anesthesia: General | Site: Ankle | Laterality: Left

## 2015-02-13 MED ORDER — BUPIVACAINE HCL (PF) 0.25 % IJ SOLN
INTRAMUSCULAR | Status: AC
  Filled 2015-02-13: qty 60

## 2015-02-13 MED ORDER — HYDROMORPHONE HCL 1 MG/ML IJ SOLN: 0.2500 mg | INTRAMUSCULAR | Status: DC | PRN

## 2015-02-13 MED ORDER — MIDAZOLAM HCL 2 MG/2ML IJ SOLN
1.0000 mg | INTRAMUSCULAR | Status: DC | PRN
  Administered 2015-02-13: 2 mg via INTRAVENOUS

## 2015-02-13 MED ORDER — FENTANYL CITRATE (PF) 100 MCG/2ML IJ SOLN
50.0000 ug | INTRAMUSCULAR | Status: DC | PRN
  Administered 2015-02-13: 25 ug via INTRAVENOUS
  Administered 2015-02-13: 50 ug via INTRAVENOUS

## 2015-02-13 MED ORDER — BUPIVACAINE HCL 0.25 % IJ SOLN
INTRAMUSCULAR | Status: DC | PRN
  Administered 2015-02-13: 10 mL

## 2015-02-13 MED ORDER — FENTANYL CITRATE (PF) 100 MCG/2ML IJ SOLN
INTRAMUSCULAR | Status: AC
  Filled 2015-02-13: qty 4

## 2015-02-13 MED ORDER — OXYCODONE HCL 5 MG PO TABS: 5.0000 mg | ORAL_TABLET | Freq: Once | ORAL | Status: DC | PRN

## 2015-02-13 MED ORDER — LACTATED RINGERS IV SOLN
INTRAVENOUS | Status: DC
  Administered 2015-02-13: 10:00:00 via INTRAVENOUS

## 2015-02-13 MED ORDER — ONDANSETRON HCL 4 MG/2ML IJ SOLN
INTRAMUSCULAR | Status: AC
  Filled 2015-02-13: qty 2

## 2015-02-13 MED ORDER — OXYCODONE HCL 5 MG/5ML PO SOLN: 5.0000 mg | Freq: Once | ORAL | Status: DC | PRN

## 2015-02-13 MED ORDER — DEXAMETHASONE SODIUM PHOSPHATE 10 MG/ML IJ SOLN
INTRAMUSCULAR | Status: AC
  Filled 2015-02-13: qty 1

## 2015-02-13 MED ORDER — CEFAZOLIN SODIUM-DEXTROSE 2-3 GM-% IV SOLR
INTRAVENOUS | Status: AC
  Filled 2015-02-13: qty 50

## 2015-02-13 MED ORDER — ONDANSETRON HCL 4 MG/2ML IJ SOLN
INTRAMUSCULAR | Status: DC | PRN
  Administered 2015-02-13: 4 mg via INTRAVENOUS

## 2015-02-13 MED ORDER — MEPERIDINE HCL 25 MG/ML IJ SOLN: 6.2500 mg | INTRAMUSCULAR | Status: DC | PRN

## 2015-02-13 MED ORDER — SCOPOLAMINE 1 MG/3DAYS TD PT72: 1.0000 | MEDICATED_PATCH | Freq: Once | TRANSDERMAL | Status: DC | PRN

## 2015-02-13 MED ORDER — PROPOFOL 10 MG/ML IV BOLUS
INTRAVENOUS | Status: DC | PRN
  Administered 2015-02-13: 200 mg via INTRAVENOUS

## 2015-02-13 MED ORDER — MIDAZOLAM HCL 2 MG/2ML IJ SOLN
INTRAMUSCULAR | Status: AC
  Filled 2015-02-13: qty 4

## 2015-02-13 MED ORDER — LIDOCAINE HCL (CARDIAC) 20 MG/ML IV SOLN
INTRAVENOUS | Status: AC
  Filled 2015-02-13: qty 5

## 2015-02-13 MED ORDER — CEFAZOLIN SODIUM-DEXTROSE 2-3 GM-% IV SOLR
2.0000 g | INTRAVENOUS | Status: AC
  Administered 2015-02-13: 2 g via INTRAVENOUS

## 2015-02-13 MED ORDER — LIDOCAINE HCL (CARDIAC) 20 MG/ML IV SOLN
INTRAVENOUS | Status: DC | PRN
  Administered 2015-02-13: 60 mg via INTRAVENOUS

## 2015-02-13 MED ORDER — GLYCOPYRROLATE 0.2 MG/ML IJ SOLN: 0.2000 mg | Freq: Once | INTRAMUSCULAR | Status: DC | PRN

## 2015-02-13 MED ORDER — HYDROCODONE-ACETAMINOPHEN 5-325 MG PO TABS: 1.0000 | ORAL_TABLET | Freq: Four times a day (QID) | ORAL | Status: DC | PRN

## 2015-02-13 SURGICAL SUPPLY — 75 items
APL SKNCLS STERI-STRIP NONHPOA (GAUZE/BANDAGES/DRESSINGS)
BANDAGE ELASTIC 4 VELCRO ST LF (GAUZE/BANDAGES/DRESSINGS) IMPLANT
BANDAGE ELASTIC 6 VELCRO ST LF (GAUZE/BANDAGES/DRESSINGS) ×3 IMPLANT
BANDAGE ESMARK 6X9 LF (GAUZE/BANDAGES/DRESSINGS) ×1 IMPLANT
BENZOIN TINCTURE PRP APPL 2/3 (GAUZE/BANDAGES/DRESSINGS) IMPLANT
BLADE HEX COATED 2.75 (ELECTRODE) IMPLANT
BLADE SURG 15 STRL LF DISP TIS (BLADE) ×2 IMPLANT
BLADE SURG 15 STRL SS (BLADE) ×6
BNDG CMPR 9X6 STRL LF SNTH (GAUZE/BANDAGES/DRESSINGS) ×1
BNDG COHESIVE 3X5 TAN STRL LF (GAUZE/BANDAGES/DRESSINGS) ×3 IMPLANT
BNDG ESMARK 6X9 LF (GAUZE/BANDAGES/DRESSINGS) ×3
CANISTER SUCT 1200ML W/VALVE (MISCELLANEOUS) ×3 IMPLANT
CLOSURE WOUND 1/2 X4 (GAUZE/BANDAGES/DRESSINGS)
COVER BACK TABLE 60X90IN (DRAPES) ×3 IMPLANT
CUFF TOURNIQUET SINGLE 24IN (TOURNIQUET CUFF) IMPLANT
CUFF TOURNIQUET SINGLE 34IN LL (TOURNIQUET CUFF) IMPLANT
DECANTER SPIKE VIAL GLASS SM (MISCELLANEOUS) ×2 IMPLANT
DRAPE EXTREMITY T 121X128X90 (DRAPE) ×3 IMPLANT
DRAPE OEC MINIVIEW 54X84 (DRAPES) ×1 IMPLANT
DRAPE U 20/CS (DRAPES) ×3 IMPLANT
DRAPE U-SHAPE 47X51 STRL (DRAPES) ×2 IMPLANT
DRSG MEPILEX BORDER 4X8 (GAUZE/BANDAGES/DRESSINGS) ×2 IMPLANT
DURAPREP 26ML APPLICATOR (WOUND CARE) ×3 IMPLANT
ELECT REM PT RETURN 9FT ADLT (ELECTROSURGICAL) ×3
ELECTRODE REM PT RTRN 9FT ADLT (ELECTROSURGICAL) ×1 IMPLANT
GAUZE SPONGE 4X4 12PLY STRL (GAUZE/BANDAGES/DRESSINGS) ×3 IMPLANT
GAUZE SPONGE 4X4 16PLY XRAY LF (GAUZE/BANDAGES/DRESSINGS) IMPLANT
GAUZE XEROFORM 1X8 LF (GAUZE/BANDAGES/DRESSINGS) ×1 IMPLANT
GLOVE BIOGEL PI IND STRL 7.0 (GLOVE) IMPLANT
GLOVE BIOGEL PI INDICATOR 7.0 (GLOVE) ×2
GLOVE ECLIPSE 6.5 STRL STRAW (GLOVE) ×2 IMPLANT
GLOVE EXAM NITRILE PF MED BLUE (GLOVE) ×2 IMPLANT
GLOVE NEODERM STRL 7.5 LF PF (GLOVE) ×1 IMPLANT
GLOVE SURG NEODERM 7.5  LF PF (GLOVE) ×2
GLOVE SURG SYN 7.5  E (GLOVE) ×2
GLOVE SURG SYN 7.5 E (GLOVE) ×1 IMPLANT
GLOVE SURG SYN 7.5 PF PI (GLOVE) ×1 IMPLANT
GOWN STRL REIN XL XLG (GOWN DISPOSABLE) ×3 IMPLANT
GOWN STRL REUS W/ TWL LRG LVL3 (GOWN DISPOSABLE) ×1 IMPLANT
GOWN STRL REUS W/TWL LRG LVL3 (GOWN DISPOSABLE) ×3
NEEDLE HYPO 22GX1.5 SAFETY (NEEDLE) ×2 IMPLANT
NS IRRIG 1000ML POUR BTL (IV SOLUTION) ×3 IMPLANT
PACK BASIN DAY SURGERY FS (CUSTOM PROCEDURE TRAY) ×3 IMPLANT
PAD CAST 3X4 CTTN HI CHSV (CAST SUPPLIES) IMPLANT
PAD CAST 4YDX4 CTTN HI CHSV (CAST SUPPLIES) IMPLANT
PADDING CAST COTTON 3X4 STRL (CAST SUPPLIES)
PADDING CAST COTTON 4X4 STRL (CAST SUPPLIES)
PADDING CAST COTTON 6X4 STRL (CAST SUPPLIES) IMPLANT
PADDING CAST SYN 6 (CAST SUPPLIES)
PADDING CAST SYNTHETIC 4 (CAST SUPPLIES)
PADDING CAST SYNTHETIC 4X4 STR (CAST SUPPLIES) IMPLANT
PADDING CAST SYNTHETIC 6X4 NS (CAST SUPPLIES) IMPLANT
PENCIL BUTTON HOLSTER BLD 10FT (ELECTRODE) ×3 IMPLANT
SLEEVE SCD COMPRESS KNEE MED (MISCELLANEOUS) IMPLANT
SPLINT FAST PLASTER 5X30 (CAST SUPPLIES)
SPLINT FIBERGLASS 3X35 (CAST SUPPLIES) IMPLANT
SPLINT FIBERGLASS 4X30 (CAST SUPPLIES) IMPLANT
SPLINT PLASTER CAST FAST 5X30 (CAST SUPPLIES) IMPLANT
SPONGE LAP 18X18 X RAY DECT (DISPOSABLE) IMPLANT
SPONGE LAP 4X18 X RAY DECT (DISPOSABLE) ×2 IMPLANT
STAPLER VISISTAT (STAPLE) IMPLANT
STRIP CLOSURE SKIN 1/2X4 (GAUZE/BANDAGES/DRESSINGS) IMPLANT
SUCTION FRAZIER TIP 10 FR DISP (SUCTIONS) ×3 IMPLANT
SUT ETHILON 3 0 PS 1 (SUTURE) ×2 IMPLANT
SUT MNCRL AB 4-0 PS2 18 (SUTURE) IMPLANT
SUT VIC AB 2-0 CT1 27 (SUTURE)
SUT VIC AB 2-0 CT1 TAPERPNT 27 (SUTURE) IMPLANT
SYR BULB 3OZ (MISCELLANEOUS) ×1 IMPLANT
SYR CONTROL 10ML LL (SYRINGE) ×2 IMPLANT
TOWEL OR 17X24 6PK STRL BLUE (TOWEL DISPOSABLE) ×3 IMPLANT
TOWEL OR NON WOVEN STRL DISP B (DISPOSABLE) ×3 IMPLANT
TUBE CONNECTING 20'X1/4 (TUBING) ×1
TUBE CONNECTING 20X1/4 (TUBING) ×2 IMPLANT
UNDERPAD 30X30 (UNDERPADS AND DIAPERS) ×3 IMPLANT
YANKAUER SUCT BULB TIP NO VENT (SUCTIONS) IMPLANT

## 2015-02-13 NOTE — Op Note (Signed)
   Date of Surgery: 02/13/2015  INDICATIONS: Thomas Walters is a 54 y.o.-year-old male who presents with retained hardware in his left ankle s/p operative fixation of syndesmosis 6 months ago;  The patient did consent to the procedure after discussion of the risks and benefits.  PREOPERATIVE DIAGNOSIS: Retained hardware Left ankle syndesmosis  POSTOPERATIVE DIAGNOSIS: Same.  PROCEDURE:  1. Removal of left ankle syndesmotic screw.  SURGEON: N. Eduard Roux, M.D.  ASSIST: none.  ANESTHESIA:  general  IV FLUIDS AND URINE: See anesthesia.  ESTIMATED BLOOD LOSS: minimal mL.  EXPLANTS: 1 3.5 mm syndesmotic screw  DRAINS: none  COMPLICATIONS: None.  DESCRIPTION OF PROCEDURE: The patient was brought to the operating room and placed supine on the operating table.  The patient had been signed prior to the procedure and this was documented. The patient had the anesthesia placed by the anesthesiologist.  A time-out was performed to confirm that this was the correct patient, site, side and location. The patient had an SCD on the opposite lower extremity. The patient did receive antibiotics prior to the incision and was re-dosed during the procedure as needed at indicated intervals.  The patient had the operative extremity prepped and draped in the standard surgical fashion.    Fluoroscopy was used to localize the location of the syndesmotic screw. An incision was made over this area. Full-thickness flaps were created. The screw was then exposed using Bovie cautery. We then backed out the screw by hand. We then performed a external rotation stress test to confirm that the syndesmosis was stable. This was confirmed on fluoroscopy. The wound was then thoroughly irrigated and closed in layer fashion using 2-0 Vicryls and 3-0 nylon. Local anesthesia was placed in the incision. Sterile dressings were applied. The patient tolerated the procedure well was x-rayed and transferred to the PACU in stable  condition.  POSTOPERATIVE PLAN: weight bearing as tolerated.  Follow up in 2 weeks for suture removal  N. Eduard Roux, MD [place] 11:08 AM

## 2015-02-13 NOTE — Anesthesia Preprocedure Evaluation (Addendum)
Anesthesia Evaluation  Patient identified by MRN, date of birth, ID band Patient awake    Reviewed: Allergy & Precautions, NPO status , Patient's Chart, lab work & pertinent test results  Airway Mallampati: II  TM Distance: >3 FB Neck ROM: Full    Dental  (+) Teeth Intact, Dental Advisory Given   Pulmonary asthma ,    breath sounds clear to auscultation       Cardiovascular hypertension, Pt. on medications  Rhythm:Regular Rate:Normal     Neuro/Psych Anxiety    GI/Hepatic GERD  Medicated,  Endo/Other  Morbid obesity  Renal/GU      Musculoskeletal  (+) Arthritis ,   Abdominal   Peds  Hematology   Anesthesia Other Findings   Reproductive/Obstetrics                            Anesthesia Physical Anesthesia Plan  ASA: II  Anesthesia Plan: General   Post-op Pain Management:    Induction: Intravenous  Airway Management Planned: LMA  Additional Equipment:   Intra-op Plan:   Post-operative Plan: Extubation in OR  Informed Consent: I have reviewed the patients History and Physical, chart, labs and discussed the procedure including the risks, benefits and alternatives for the proposed anesthesia with the patient or authorized representative who has indicated his/her understanding and acceptance.   Dental advisory given  Plan Discussed with: CRNA, Anesthesiologist and Surgeon  Anesthesia Plan Comments:         Anesthesia Quick Evaluation

## 2015-02-13 NOTE — Discharge Instructions (Signed)
Activity as tolerated.   Post Anesthesia Home Care Instructions  Activity: Get plenty of rest for the remainder of the day. A responsible adult should stay with you for 24 hours following the procedure.  For the next 24 hours, DO NOT: -Drive a car -Paediatric nurse -Drink alcoholic beverages -Take any medication unless instructed by your physician -Make any legal decisions or sign important papers.  Meals: Start with liquid foods such as gelatin or soup. Progress to regular foods as tolerated. Avoid greasy, spicy, heavy foods. If nausea and/or vomiting occur, drink only clear liquids until the nausea and/or vomiting subsides. Call your physician if vomiting continues.  Special Instructions/Symptoms: Your throat may feel dry or sore from the anesthesia or the breathing tube placed in your throat during surgery. If this causes discomfort, gargle with warm salt water. The discomfort should disappear within 24 hours.  If you had a scopolamine patch placed behind your ear for the management of post- operative nausea and/or vomiting:  1. The medication in the patch is effective for 72 hours, after which it should be removed.  Wrap patch in a tissue and discard in the trash. Wash hands thoroughly with soap and water. 2. You may remove the patch earlier than 72 hours if you experience unpleasant side effects which may include dry mouth, dizziness or visual disturbances. 3. Avoid touching the patch. Wash your hands with soap and water after contact with the patch.

## 2015-02-13 NOTE — Anesthesia Procedure Notes (Signed)
Procedure Name: LMA Insertion Date/Time: 02/13/2015 10:42 AM Performed by: BLOCKER, TIMOTHY D Pre-anesthesia Checklist: Patient identified, Emergency Drugs available, Suction available and Patient being monitored Patient Re-evaluated:Patient Re-evaluated prior to inductionOxygen Delivery Method: Circle System Utilized Preoxygenation: Pre-oxygenation with 100% oxygen Intubation Type: IV induction Ventilation: Mask ventilation without difficulty LMA: LMA inserted LMA Size: 5.0 Number of attempts: 1 Airway Equipment and Method: Bite block Placement Confirmation: positive ETCO2 Tube secured with: Tape Dental Injury: Teeth and Oropharynx as per pre-operative assessment

## 2015-02-13 NOTE — Anesthesia Postprocedure Evaluation (Signed)
  Anesthesia Post-op Note  Patient: Thomas Walters  Procedure(s) Performed: Procedure(s): HARDWARE REMOVAL LEFT ANKLE (Left)  Patient Location: PACU  Anesthesia Type: General   Level of Consciousness: awake, alert  and oriented  Airway and Oxygen Therapy: Patient Spontanous Breathing  Post-op Pain: none  Post-op Assessment: Post-op Vital signs reviewed  Post-op Vital Signs: Reviewed  Last Vitals:  Filed Vitals:   02/13/15 1153  BP: 140/90  Pulse: 86  Temp: 36.7 C  Resp: 18    Complications: No apparent anesthesia complications

## 2015-02-13 NOTE — H&P (Signed)
PREOPERATIVE H&P  Chief Complaint: left ankle retained hardware  HPI: Thomas Walters is a 54 y.o. male who presents for surgical treatment of left ankle retained hardware.  He denies any changes in medical history.  Past Medical History  Diagnosis Date  . Asthma   . Shortness of breath dyspnea     with exertion, weather changes  . Seasonal allergies   . Anxiety   . GERD (gastroesophageal reflux disease)   . Arthritis   . Cancer     Skin cancer- basal- forearm, upper chest  . Hypertension   . Seizures     non since 1999, takes lamictal   Past Surgical History  Procedure Laterality Date  . Carpal tunnel release Right   . Colonoscopy w/ polypectomy    . Orif ankle fracture Left 08/13/2014    Procedure: OPEN REDUCTION INTERNAL FIXATION (ORIF) LEFT ANKLE FRACTURE;  Surgeon: Leandrew Koyanagi, MD;  Location: [facility];  Service: Orthopedics;  Laterality: Left;   Social History   Social History  . Marital Status: Single    Spouse Name: N/A  . Number of Children: N/A  . Years of Education: N/A   Social History Main Topics  . Smoking status: Never Smoker   . Smokeless tobacco: None  . Alcohol Use: 21.0 oz/week    35 Cans of beer per week     Comment: beer daily 2  . Drug Use: Yes    Special: Marijuana     Comment: " last time 08/08/14  . Sexual Activity: Not Asked   Other Topics Concern  . None   Social History Narrative   History reviewed. No pertinent family history. Allergies  Allergen Reactions  . Prednisone Anxiety    Can't sleep   Prior to Admission medications   Medication Sig Start Date End Date Taking? Authorizing Provider  aspirin 81 MG tablet Take 81 mg by mouth daily.   Yes Historical Provider, MD  clonazePAM (KLONOPIN) 1 MG tablet Take 1 mg by mouth at bedtime. 07/05/14  Yes Historical Provider, MD  ibuprofen (ADVIL,MOTRIN) 200 MG tablet Take 200 mg by mouth every 6 (six) hours as needed (pain.).   Yes Historical Provider, MD  lamoTRIgine (LAMICTAL)  100 MG tablet Take 100 mg by mouth daily. 06/07/14  Yes Historical Provider, MD  Multiple Vitamin (MULTIVITAMIN) tablet Take 1 tablet by mouth daily.   Yes Historical Provider, MD  PROAIR HFA 108 (90 BASE) MCG/ACT inhaler Take 1 puff by mouth 2 (two) times daily as needed for wheezing or shortness of breath.  07/09/14  Yes Historical Provider, MD  traZODone (DESYREL) 150 MG tablet Take 225 mg by mouth at bedtime. 06/20/14  Yes Historical Provider, MD  oxycodone (OXY-IR) 5 MG capsule Take 10 mg by mouth every 4 (four) hours as needed.    Historical Provider, MD     Positive ROS: All other systems have been reviewed and were otherwise negative with the exception of those mentioned in the HPI and as above.  Physical Exam: General: Alert, no acute distress Cardiovascular: No pedal edema Respiratory: No cyanosis, no use of accessory musculature GI: abdomen soft Skin: No lesions in the area of chief complaint Neurologic: Sensation intact distally Psychiatric: Patient is competent for consent with normal mood and affect Lymphatic: no lymphedema  MUSCULOSKELETAL: exam stable  Assessment: left ankle retained hardware  Plan: Plan for Procedure(s): HARDWARE REMOVAL LEFT ANKLE  The risks benefits and alternatives were discussed with the patient including but not  limited to the risks of nonoperative treatment, versus surgical intervention including infection, bleeding, nerve injury,  blood clots, cardiopulmonary complications, morbidity, mortality, among others, and they were willing to proceed.   Marianna Payment, MD   02/13/2015 7:18 AM

## 2015-02-13 NOTE — Transfer of Care (Signed)
Immediate Anesthesia Transfer of Care Note  Patient: Thomas Walters  Procedure(s) Performed: Procedure(s): HARDWARE REMOVAL LEFT ANKLE (Left)  Patient Location: PACU  Anesthesia Type:General  Level of Consciousness: awake, sedated and patient cooperative  Airway & Oxygen Therapy: Patient Spontanous Breathing and Patient connected to face mask oxygen  Post-op Assessment: Report given to RN and Post -op Vital signs reviewed and stable  Post vital signs: Reviewed and stable  Last Vitals:  Filed Vitals:   02/13/15 0932  BP: 140/90  Pulse: 91  Temp: 36.7 C  Resp: 20    Complications: No apparent anesthesia complications

## 2015-02-14 ENCOUNTER — Encounter (HOSPITAL_BASED_OUTPATIENT_CLINIC_OR_DEPARTMENT_OTHER): Payer: Self-pay | Admitting: Orthopaedic Surgery

## 2015-04-04 ENCOUNTER — Other Ambulatory Visit (HOSPITAL_COMMUNITY): Payer: Self-pay | Admitting: Respiratory Therapy

## 2015-04-05 ENCOUNTER — Other Ambulatory Visit (HOSPITAL_COMMUNITY): Payer: Self-pay | Admitting: Respiratory Therapy

## 2015-04-05 DIAGNOSIS — G40A01 Absence epileptic syndrome, not intractable, with status epilepticus: Secondary | ICD-10-CM

## 2015-04-24 ENCOUNTER — Ambulatory Visit (HOSPITAL_COMMUNITY)
Admission: RE | Admit: 2015-04-24 | Discharge: 2015-04-24 | Disposition: A | Discharge status: Home / Self Care | Payer: BLUE CROSS/BLUE SHIELD | Source: Ambulatory Visit | Attending: Family Medicine | Admitting: Family Medicine

## 2015-04-24 DIAGNOSIS — G40A01 Absence epileptic syndrome, not intractable, with status epilepticus: Secondary | ICD-10-CM

## 2015-04-24 NOTE — Progress Notes (Signed)
EEG Completed; Results Pending  

## 2015-05-02 NOTE — Procedures (Signed)
ELECTROENCEPHALOGRAM REPORT  Patient: Thomas Walters       Room #: Outpatient EEG No. ID: [id number] Age: 54 y.o.        Sex: male Referring Physician: Claris Gower Report Date:  05/02/2015        Interpreting Physician: Anthony Sar  History: Thomas Walters is an 54 y.o. 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 Discontinuation of medication is being considered.  Indications for study:  Rule out interictal epileptiform activity.  Technique: This is an 18 channel routine scalp EEG performed at the bedside with bipolar and monopolar montages arranged in accordance to the international 10/20 system of electrode placement.   Description:This EEG recording was performed during wakefulness. Predominant activity consisted of 11  Hz alpha rhythm with good attenuation with eye opening. Low amplitude fast beta activity was also noted in the frontal and central regions. Photic stimulation produced symmetrical occipital driving response.  Hyperventilation was not performed.  No epileptiform discharges were recorded. There was no abnormal slowing of cerebral activity.  Interpretation: This is a normal EEG recording during wakefulness. No evidence of an epileptic disorder was demonstrated. However, a normal EEG recording in and of itself does not rule out seizure disorder.   Rush Farmer M.D. [facility] [phone]

## 2016-05-25 IMAGING — CT CT ANKLE*L* W/O CM
1 series · 12 of 14 positions shown, 15 images · non-contrast
Comparison: Ankle radiographs same day.

CLINICAL DATA: Left ankle fracture sustained falling down steps
today. Surgical planning. Initial encounter.

EXAM:
CT OF THE LEFT ANKLE WITHOUT CONTRAST
TECHNIQUE: Multidetector CT imaging of the left ankle was performed according
to the standard protocol. Multiplanar CT image reconstructions were
also generated.

[Series 3: pelvis st · axial · 0.38mm/px · z∈[-1564,-1406]mm · 12 of 95 slices shown, 15 images]
[im 8/95  soft-tissue]
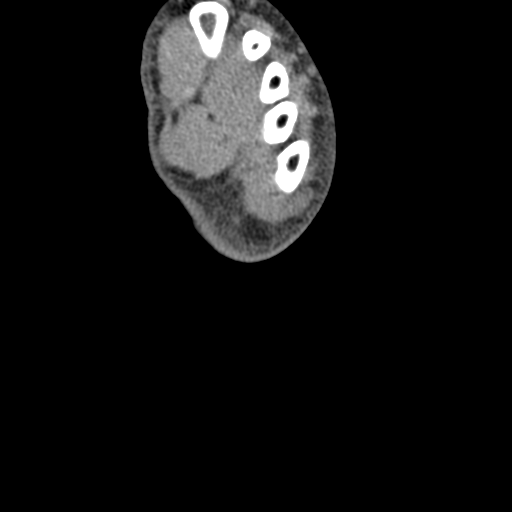
[im 8/95  bone]
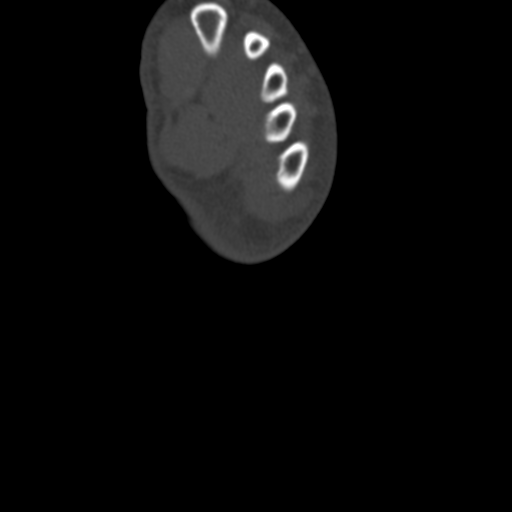
[im 15/95  bone]
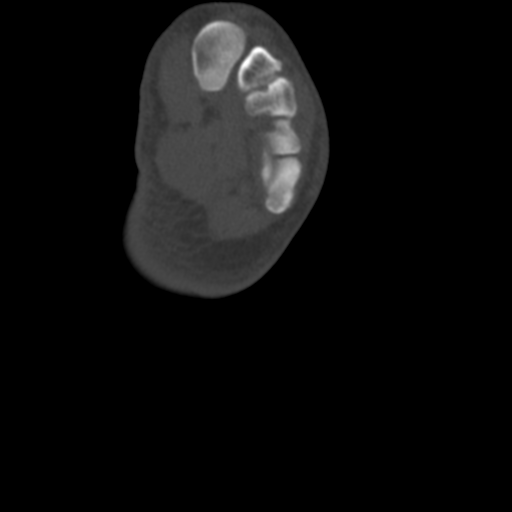
[im 22/95  bone]
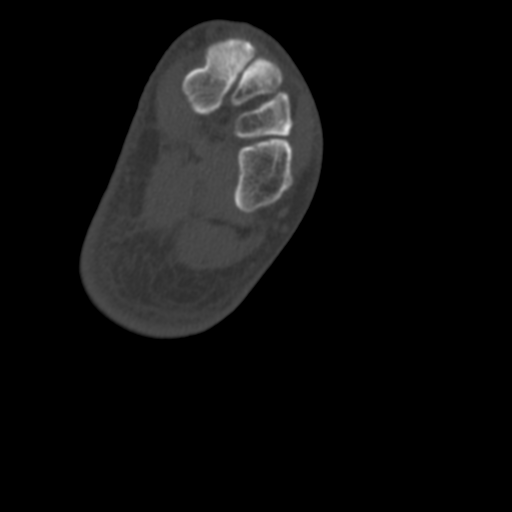
[im 29/95  bone]
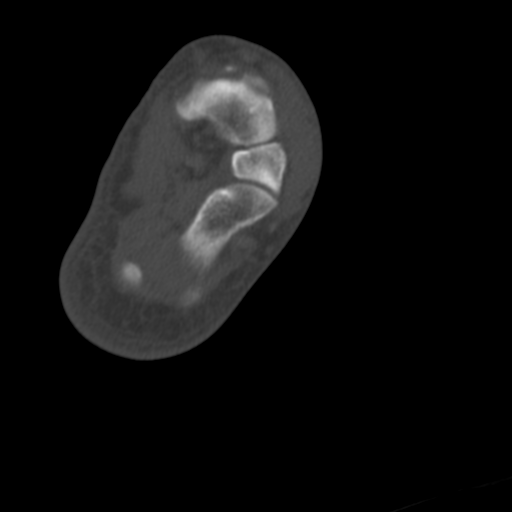
[im 37/95  soft-tissue]
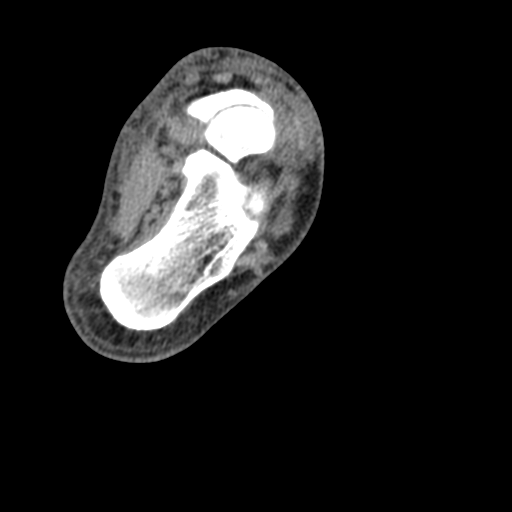
[im 37/95  bone]
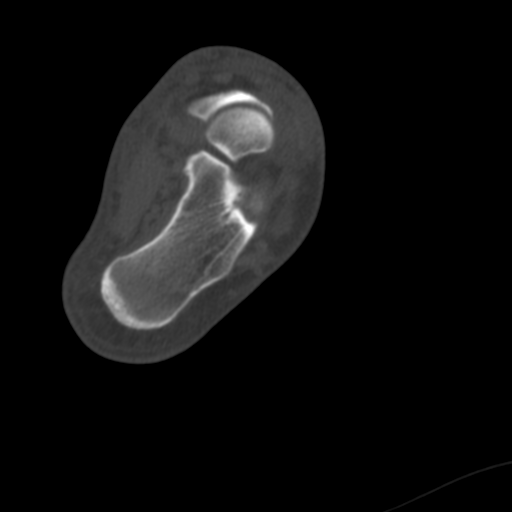
[im 44/95  bone]
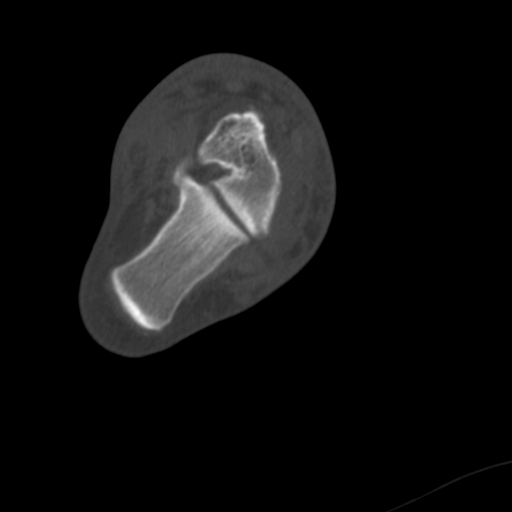
[im 51/95  bone]
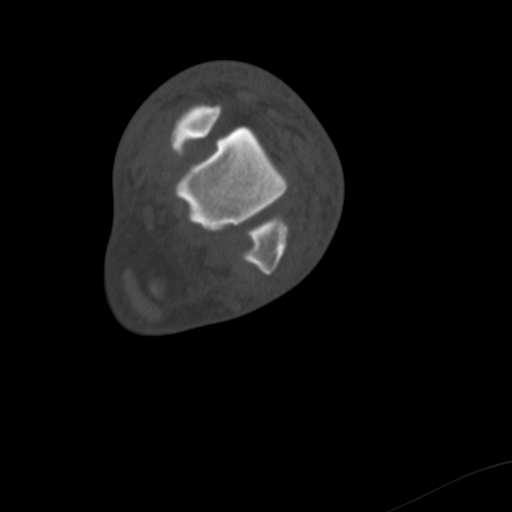
[im 58/95  bone]
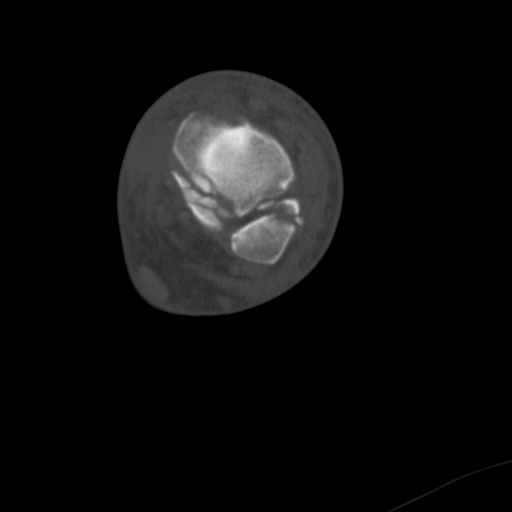
[im 66/95  soft-tissue]
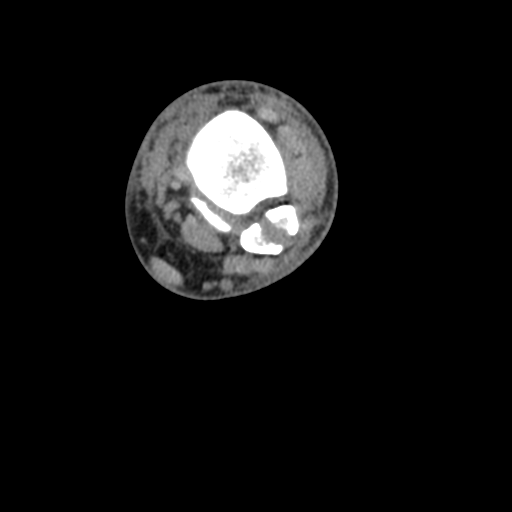
[im 66/95  bone]
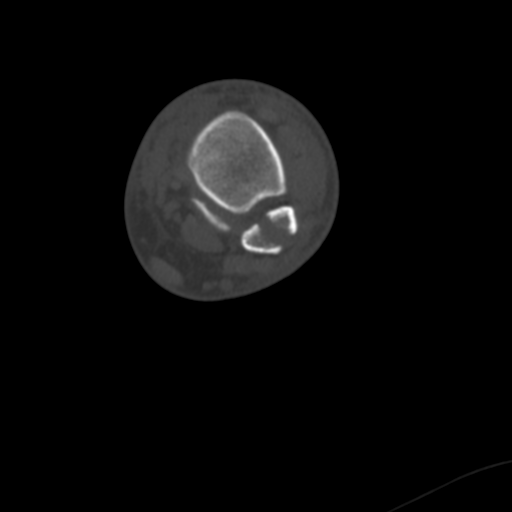
[im 73/95  bone]
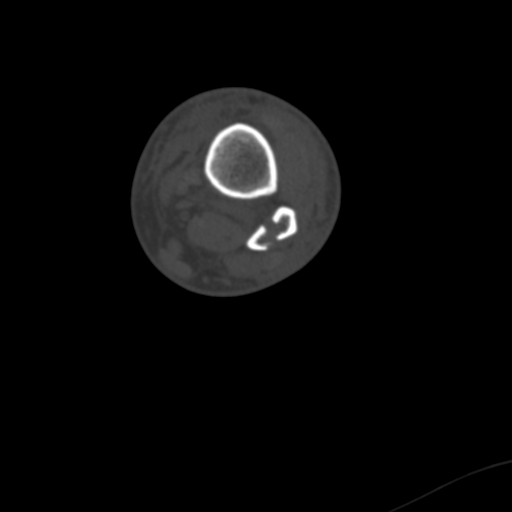
[im 80/95  bone]
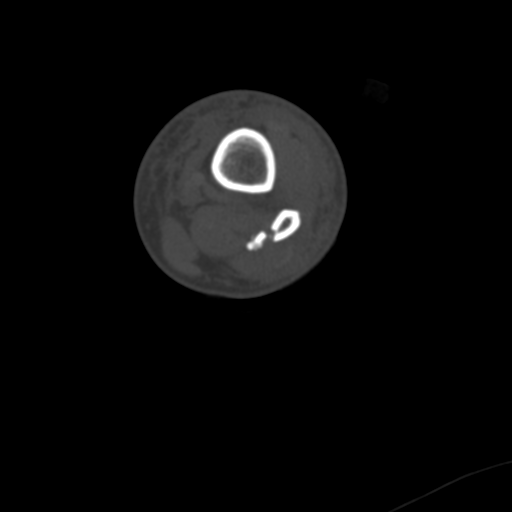
[im 87/95  bone]
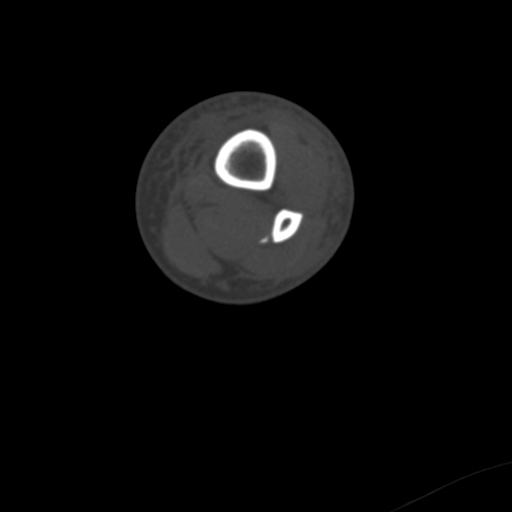

[12 of 14 positions shown; findings below may reference images not displayed]

FINDINGS: Comminuted fracture of the posterior malleolus of the distal tibia
demonstrates up to 6 mm of displacement at the articular surface.
There is also a displaced fracture involving the base of the medial
malleolus, demonstrating up to 7 mm of displacement anteriorly.

Oblique fracture of the distal fibula extends proximally from the
ankle mortise by approximately 6 cm and is associated with up to 8
mm of displacement. This fracture extends into the distal
tibiofibular joint.

There is anterolateral widening of the tibiotalar joint without
subluxation of the talus. There are several fracture fragments
interposed between the tibial plafond and and the talus. No
osteochondral lesion of the talus identified.

The calcaneus and bones of the midfoot appear normal.

Moderate soft tissue swelling is noted surrounding the ankle with a
moderate size ankle joint effusion. The posterior tibialis tendon is
laterally displaced into the fracture of the posterior malleolus
(axial 33 through 39). No tendon discontinuity identified. The
perineal tendons are intact.
IMPRESSION: 1. Displaced trimalleolar fracture of the left ankle as described.
2. Anterolateral widening of the tibiotalar joint with multiple
fracture fragments interposed between the tibial plafond and the
talar dome. No evidence of tarsal bone fracture.
3. Potential entrapment of the posterior tibialis tendon within the
fracture of the posterior malleolus.

## 2016-11-24 ENCOUNTER — Ambulatory Visit (INDEPENDENT_AMBULATORY_CARE_PROVIDER_SITE_OTHER): Payer: BLUE CROSS/BLUE SHIELD

## 2016-11-24 ENCOUNTER — Ambulatory Visit (INDEPENDENT_AMBULATORY_CARE_PROVIDER_SITE_OTHER): Payer: BLUE CROSS/BLUE SHIELD | Admitting: Orthopaedic Surgery

## 2016-11-24 DIAGNOSIS — M19172 Post-traumatic osteoarthritis, left ankle and foot: Secondary | ICD-10-CM | POA: Diagnosis not present

## 2016-11-24 DIAGNOSIS — M79672 Pain in left foot: Secondary | ICD-10-CM | POA: Diagnosis not present

## 2016-11-24 MED ORDER — METHYLPREDNISOLONE ACETATE 40 MG/ML IJ SUSP
40.0000 mg | INTRAMUSCULAR | Status: AC | PRN
  Administered 2016-11-24: 40 mg via INTRA_ARTICULAR

## 2016-11-24 MED ORDER — BUPIVACAINE HCL 0.5 % IJ SOLN
1.0000 mL | INTRAMUSCULAR | Status: AC | PRN
  Administered 2016-11-24: 1 mL via INTRA_ARTICULAR

## 2016-11-24 MED ORDER — LIDOCAINE HCL 1 % IJ SOLN
5.0000 mL | INTRAMUSCULAR | Status: AC | PRN
  Administered 2016-11-24: 5 mL

## 2016-11-24 NOTE — Progress Notes (Signed)
Office Visit Note   Patient: Thomas Walters           Date of Birth: Feb 06, 1961           MRN: [id number] Visit Date: 11/24/2016              Requested by: No referring provider defined for this encounter. PCP: Patient, No Pcp Per   Assessment & Plan: Visit Diagnoses:  1. Pain in left foot   2. Post-traumatic osteoarthritis, left ankle and foot     Plan: X-rays are essentially normal. He does show degenerative arthritis of the left ankle with a loose body. We performed an ankle injection today. Given some relief. I do recommend Cam Walker use as needed. May benefit from an ankle scope if he does not respond well to the injection. Would also consider CT scan of the ankle to fully evaluate for degenerative arthritis  Follow-Up Instructions: Return if symptoms worsen or fail to improve.   Orders:  Orders Placed This Encounter  Procedures  . XR Foot Complete Left   No orders of the defined types were placed in this encounter.     Procedures: Medium Joint Inj Date/Time: 11/24/2016 1:04 PM Performed by: Leandrew Koyanagi Authorized by: Leandrew Koyanagi   Location:  Ankle Site:  L ankle Needle Size:  22 G Ultrasound Guided: No   Fluoroscopic Guidance: No   Medications:  5 mL lidocaine 1 %; 1 mL bupivacaine 0.5 %; 40 mg methylPREDNISolone acetate 40 MG/ML     Clinical Data: No additional findings.   Subjective: Chief Complaint  Patient presents with  . Left Foot - Pain  . Left Ankle - Follow-up    Patient is a 56 year old gentleman who is 2 years status post ORIF left fracture. He is overall doing well. He is complaining of left ankle pain and swelling. He recently also had some bruising on the dorsum of his forefoot. Cannot recall a specific injuries. He denies any numbness or tingling.    Review of Systems  Constitutional: Negative.   All other systems reviewed and are negative.    Objective: Vital Signs: There were no vitals taken for this visit.  Physical  Exam  Constitutional: He is oriented to person, place, and time. He appears well-developed and well-nourished.  Pulmonary/Chest: Effort normal.  Abdominal: Soft.  Neurological: He is alert and oriented to person, place, and time.  Skin: Skin is warm.  Psychiatric: He has a normal mood and affect. His behavior is normal. Judgment and thought content normal.  Nursing note and vitals reviewed.   Ortho Exam Left ankle exam shows swelling of the ankle joint. He has good range of motion. The surgical scar is well-healed.. He does have some mild bruising of the dorsum of his forefoot. Metatarsals were not overly tender to palpation. Specialty Comments:  No specialty comments available.  Imaging: Xr Foot Complete Left  Result Date: 11/24/2016 No acute or structural abnormalities    PMFS History: There are no active problems to display for this patient.  Past Medical History:  Diagnosis Date  . Anxiety   . Arthritis   . Asthma   . Cancer    Skin cancer- basal- forearm, upper chest  . GERD (gastroesophageal reflux disease)   . Hypertension   . Seasonal allergies   . Seizures    non since 1999, takes lamictal  . Shortness of breath dyspnea    with exertion, weather changes    No family history  on file.  Past Surgical History:  Procedure Laterality Date  . CARPAL TUNNEL RELEASE Right   . COLONOSCOPY W/ POLYPECTOMY    . HARDWARE REMOVAL Left 02/13/2015   Procedure: HARDWARE REMOVAL LEFT ANKLE;  Surgeon: Leandrew Koyanagi, MD;  Location: [facility];  Service: Orthopedics;  Laterality: Left;  . ORIF ANKLE FRACTURE Left 08/13/2014   Procedure: OPEN REDUCTION INTERNAL FIXATION (ORIF) LEFT ANKLE FRACTURE;  Surgeon: Leandrew Koyanagi, MD;  Location: [facility];  Service: Orthopedics;  Laterality: Left;   Social History   Occupational History  . Not on file.   Social History Main Topics  . Smoking status: Never Smoker  . Smokeless tobacco: Not on file  . Alcohol use 21.0 oz/week     35 Cans of beer per week     Comment: beer daily 2  . Drug use: Yes    Types: Marijuana     Comment: " last time 08/08/14  . Sexual activity: Not on file

## 2017-05-05 ENCOUNTER — Ambulatory Visit
Admission: RE | Admit: 2017-05-05 | Discharge: 2017-05-05 | Disposition: A | Discharge status: Home / Self Care | Payer: BLUE CROSS/BLUE SHIELD | Source: Ambulatory Visit | Attending: Family Medicine | Admitting: Family Medicine

## 2017-05-05 ENCOUNTER — Other Ambulatory Visit: Payer: Self-pay | Admitting: Family Medicine

## 2017-05-05 DIAGNOSIS — N202 Calculus of kidney with calculus of ureter: Secondary | ICD-10-CM

## 2017-06-02 DIAGNOSIS — F411 Generalized anxiety disorder: Secondary | ICD-10-CM | POA: Diagnosis not present

## 2017-06-08 DIAGNOSIS — I10 Essential (primary) hypertension: Secondary | ICD-10-CM | POA: Diagnosis not present

## 2017-06-08 DIAGNOSIS — E119 Type 2 diabetes mellitus without complications: Secondary | ICD-10-CM | POA: Diagnosis not present

## 2017-06-08 DIAGNOSIS — F39 Unspecified mood [affective] disorder: Secondary | ICD-10-CM | POA: Diagnosis not present

## 2017-06-08 DIAGNOSIS — Z6841 Body Mass Index (BMI) 40.0 and over, adult: Secondary | ICD-10-CM | POA: Diagnosis not present

## 2017-06-16 DIAGNOSIS — F411 Generalized anxiety disorder: Secondary | ICD-10-CM | POA: Diagnosis not present

## 2017-07-07 DIAGNOSIS — F411 Generalized anxiety disorder: Secondary | ICD-10-CM | POA: Diagnosis not present

## 2017-07-21 DIAGNOSIS — F411 Generalized anxiety disorder: Secondary | ICD-10-CM | POA: Diagnosis not present

## 2017-08-04 DIAGNOSIS — F411 Generalized anxiety disorder: Secondary | ICD-10-CM | POA: Diagnosis not present

## 2017-08-18 DIAGNOSIS — F411 Generalized anxiety disorder: Secondary | ICD-10-CM | POA: Diagnosis not present

## 2017-09-01 DIAGNOSIS — F411 Generalized anxiety disorder: Secondary | ICD-10-CM | POA: Diagnosis not present

## 2017-09-22 DIAGNOSIS — F411 Generalized anxiety disorder: Secondary | ICD-10-CM | POA: Diagnosis not present

## 2017-10-20 DIAGNOSIS — F411 Generalized anxiety disorder: Secondary | ICD-10-CM | POA: Diagnosis not present

## 2017-11-10 DIAGNOSIS — F411 Generalized anxiety disorder: Secondary | ICD-10-CM | POA: Diagnosis not present

## 2017-12-01 DIAGNOSIS — F411 Generalized anxiety disorder: Secondary | ICD-10-CM | POA: Diagnosis not present

## 2017-12-22 DIAGNOSIS — F411 Generalized anxiety disorder: Secondary | ICD-10-CM | POA: Diagnosis not present

## 2017-12-28 DIAGNOSIS — F39 Unspecified mood [affective] disorder: Secondary | ICD-10-CM | POA: Diagnosis not present

## 2017-12-28 DIAGNOSIS — E119 Type 2 diabetes mellitus without complications: Secondary | ICD-10-CM | POA: Diagnosis not present

## 2017-12-28 DIAGNOSIS — L989 Disorder of the skin and subcutaneous tissue, unspecified: Secondary | ICD-10-CM | POA: Diagnosis not present

## 2017-12-28 DIAGNOSIS — I10 Essential (primary) hypertension: Secondary | ICD-10-CM | POA: Diagnosis not present

## 2017-12-29 DIAGNOSIS — C44619 Basal cell carcinoma of skin of left upper limb, including shoulder: Secondary | ICD-10-CM | POA: Diagnosis not present

## 2018-01-03 DIAGNOSIS — E119 Type 2 diabetes mellitus without complications: Secondary | ICD-10-CM | POA: Diagnosis not present

## 2018-01-03 DIAGNOSIS — C44619 Basal cell carcinoma of skin of left upper limb, including shoulder: Secondary | ICD-10-CM | POA: Diagnosis not present

## 2018-01-03 DIAGNOSIS — F39 Unspecified mood [affective] disorder: Secondary | ICD-10-CM | POA: Diagnosis not present

## 2018-01-12 DIAGNOSIS — F411 Generalized anxiety disorder: Secondary | ICD-10-CM | POA: Diagnosis not present

## 2018-01-26 DIAGNOSIS — F411 Generalized anxiety disorder: Secondary | ICD-10-CM | POA: Diagnosis not present

## 2018-02-08 DIAGNOSIS — D0461 Carcinoma in situ of skin of right upper limb, including shoulder: Secondary | ICD-10-CM | POA: Diagnosis not present

## 2018-02-08 DIAGNOSIS — D0462 Carcinoma in situ of skin of left upper limb, including shoulder: Secondary | ICD-10-CM | POA: Diagnosis not present

## 2018-02-08 DIAGNOSIS — D225 Melanocytic nevi of trunk: Secondary | ICD-10-CM | POA: Diagnosis not present

## 2018-02-08 DIAGNOSIS — D1801 Hemangioma of skin and subcutaneous tissue: Secondary | ICD-10-CM | POA: Diagnosis not present

## 2018-02-08 DIAGNOSIS — C44619 Basal cell carcinoma of skin of left upper limb, including shoulder: Secondary | ICD-10-CM | POA: Diagnosis not present

## 2018-02-09 DIAGNOSIS — F411 Generalized anxiety disorder: Secondary | ICD-10-CM | POA: Diagnosis not present

## 2018-02-23 DIAGNOSIS — F411 Generalized anxiety disorder: Secondary | ICD-10-CM | POA: Diagnosis not present

## 2018-03-09 DIAGNOSIS — F411 Generalized anxiety disorder: Secondary | ICD-10-CM | POA: Diagnosis not present

## 2018-03-29 DIAGNOSIS — F419 Anxiety disorder, unspecified: Secondary | ICD-10-CM | POA: Diagnosis not present

## 2018-03-29 DIAGNOSIS — I10 Essential (primary) hypertension: Secondary | ICD-10-CM | POA: Diagnosis not present

## 2018-03-29 DIAGNOSIS — F39 Unspecified mood [affective] disorder: Secondary | ICD-10-CM | POA: Diagnosis not present

## 2018-03-29 DIAGNOSIS — E119 Type 2 diabetes mellitus without complications: Secondary | ICD-10-CM | POA: Diagnosis not present

## 2018-03-30 DIAGNOSIS — F411 Generalized anxiety disorder: Secondary | ICD-10-CM | POA: Diagnosis not present

## 2018-04-13 DIAGNOSIS — F411 Generalized anxiety disorder: Secondary | ICD-10-CM | POA: Diagnosis not present

## 2018-04-27 DIAGNOSIS — F411 Generalized anxiety disorder: Secondary | ICD-10-CM | POA: Diagnosis not present

## 2018-05-11 DIAGNOSIS — F411 Generalized anxiety disorder: Secondary | ICD-10-CM | POA: Diagnosis not present

## 2018-05-17 DIAGNOSIS — G4700 Insomnia, unspecified: Secondary | ICD-10-CM | POA: Diagnosis not present

## 2018-05-17 DIAGNOSIS — I10 Essential (primary) hypertension: Secondary | ICD-10-CM | POA: Diagnosis not present

## 2018-05-17 DIAGNOSIS — Z23 Encounter for immunization: Secondary | ICD-10-CM | POA: Diagnosis not present

## 2018-05-17 DIAGNOSIS — Z79899 Other long term (current) drug therapy: Secondary | ICD-10-CM | POA: Diagnosis not present

## 2018-05-17 DIAGNOSIS — Z125 Encounter for screening for malignant neoplasm of prostate: Secondary | ICD-10-CM | POA: Diagnosis not present

## 2018-05-17 DIAGNOSIS — J4530 Mild persistent asthma, uncomplicated: Secondary | ICD-10-CM | POA: Diagnosis not present

## 2018-05-17 DIAGNOSIS — E1169 Type 2 diabetes mellitus with other specified complication: Secondary | ICD-10-CM | POA: Diagnosis not present

## 2018-05-17 DIAGNOSIS — Z0000 Encounter for general adult medical examination without abnormal findings: Secondary | ICD-10-CM | POA: Diagnosis not present

## 2018-06-15 DIAGNOSIS — F411 Generalized anxiety disorder: Secondary | ICD-10-CM | POA: Diagnosis not present

## 2018-07-06 DIAGNOSIS — F411 Generalized anxiety disorder: Secondary | ICD-10-CM | POA: Diagnosis not present

## 2018-07-26 DIAGNOSIS — Z0000 Encounter for general adult medical examination without abnormal findings: Secondary | ICD-10-CM | POA: Diagnosis not present

## 2018-07-26 DIAGNOSIS — I10 Essential (primary) hypertension: Secondary | ICD-10-CM | POA: Diagnosis not present

## 2018-08-03 DIAGNOSIS — F411 Generalized anxiety disorder: Secondary | ICD-10-CM | POA: Diagnosis not present

## 2018-09-01 DIAGNOSIS — E7800 Pure hypercholesterolemia, unspecified: Secondary | ICD-10-CM | POA: Diagnosis not present

## 2018-09-01 DIAGNOSIS — Z79899 Other long term (current) drug therapy: Secondary | ICD-10-CM | POA: Diagnosis not present

## 2018-11-28 DIAGNOSIS — Z79899 Other long term (current) drug therapy: Secondary | ICD-10-CM | POA: Diagnosis not present

## 2018-11-28 DIAGNOSIS — I10 Essential (primary) hypertension: Secondary | ICD-10-CM | POA: Diagnosis not present

## 2018-11-28 DIAGNOSIS — E1169 Type 2 diabetes mellitus with other specified complication: Secondary | ICD-10-CM | POA: Diagnosis not present

## 2018-11-28 DIAGNOSIS — F411 Generalized anxiety disorder: Secondary | ICD-10-CM | POA: Diagnosis not present

## 2019-02-03 DIAGNOSIS — L814 Other melanin hyperpigmentation: Secondary | ICD-10-CM | POA: Diagnosis not present

## 2019-02-03 DIAGNOSIS — L821 Other seborrheic keratosis: Secondary | ICD-10-CM | POA: Diagnosis not present

## 2019-02-03 DIAGNOSIS — Z85828 Personal history of other malignant neoplasm of skin: Secondary | ICD-10-CM | POA: Diagnosis not present

## 2019-02-03 DIAGNOSIS — D0422 Carcinoma in situ of skin of left ear and external auricular canal: Secondary | ICD-10-CM | POA: Diagnosis not present

## 2019-02-03 DIAGNOSIS — L570 Actinic keratosis: Secondary | ICD-10-CM | POA: Diagnosis not present

## 2019-04-10 DIAGNOSIS — E1169 Type 2 diabetes mellitus with other specified complication: Secondary | ICD-10-CM | POA: Diagnosis not present

## 2019-04-10 DIAGNOSIS — Z79899 Other long term (current) drug therapy: Secondary | ICD-10-CM | POA: Diagnosis not present

## 2019-04-10 DIAGNOSIS — J4530 Mild persistent asthma, uncomplicated: Secondary | ICD-10-CM | POA: Diagnosis not present

## 2019-04-10 DIAGNOSIS — Z23 Encounter for immunization: Secondary | ICD-10-CM | POA: Diagnosis not present

## 2019-04-10 DIAGNOSIS — I10 Essential (primary) hypertension: Secondary | ICD-10-CM | POA: Diagnosis not present

## 2019-06-12 DIAGNOSIS — Z23 Encounter for immunization: Secondary | ICD-10-CM | POA: Diagnosis not present

## 2019-06-16 DIAGNOSIS — E119 Type 2 diabetes mellitus without complications: Secondary | ICD-10-CM | POA: Diagnosis not present

## 2019-08-02 DIAGNOSIS — I10 Essential (primary) hypertension: Secondary | ICD-10-CM | POA: Diagnosis not present

## 2019-08-02 DIAGNOSIS — Z1159 Encounter for screening for other viral diseases: Secondary | ICD-10-CM | POA: Diagnosis not present

## 2019-08-02 DIAGNOSIS — E1169 Type 2 diabetes mellitus with other specified complication: Secondary | ICD-10-CM | POA: Diagnosis not present

## 2019-08-02 DIAGNOSIS — Z79899 Other long term (current) drug therapy: Secondary | ICD-10-CM | POA: Diagnosis not present

## 2019-08-02 DIAGNOSIS — Z0000 Encounter for general adult medical examination without abnormal findings: Secondary | ICD-10-CM | POA: Diagnosis not present

## 2019-08-02 DIAGNOSIS — Z125 Encounter for screening for malignant neoplasm of prostate: Secondary | ICD-10-CM | POA: Diagnosis not present

## 2019-08-02 DIAGNOSIS — E7800 Pure hypercholesterolemia, unspecified: Secondary | ICD-10-CM | POA: Diagnosis not present

## 2020-02-07 DIAGNOSIS — E1169 Type 2 diabetes mellitus with other specified complication: Secondary | ICD-10-CM | POA: Diagnosis not present

## 2020-02-07 DIAGNOSIS — Z23 Encounter for immunization: Secondary | ICD-10-CM | POA: Diagnosis not present

## 2020-02-07 DIAGNOSIS — I10 Essential (primary) hypertension: Secondary | ICD-10-CM | POA: Diagnosis not present

## 2020-02-07 DIAGNOSIS — J4530 Mild persistent asthma, uncomplicated: Secondary | ICD-10-CM | POA: Diagnosis not present

## 2020-05-29 DIAGNOSIS — I4892 Unspecified atrial flutter: Secondary | ICD-10-CM | POA: Diagnosis not present

## 2020-05-29 DIAGNOSIS — I10 Essential (primary) hypertension: Secondary | ICD-10-CM | POA: Diagnosis not present

## 2020-05-29 DIAGNOSIS — R000 Tachycardia, unspecified: Secondary | ICD-10-CM | POA: Diagnosis not present

## 2020-05-29 DIAGNOSIS — E1169 Type 2 diabetes mellitus with other specified complication: Secondary | ICD-10-CM | POA: Diagnosis not present

## 2020-06-27 ENCOUNTER — Ambulatory Visit (INDEPENDENT_AMBULATORY_CARE_PROVIDER_SITE_OTHER): Payer: BC Managed Care – PPO | Admitting: Internal Medicine

## 2020-06-27 ENCOUNTER — Encounter: Payer: Self-pay | Admitting: Internal Medicine

## 2020-06-27 ENCOUNTER — Other Ambulatory Visit: Payer: Self-pay

## 2020-06-27 VITALS — BP 168/96 | HR 105 | Ht 66.0 in | Wt 241.8 lb

## 2020-06-27 DIAGNOSIS — E1169 Type 2 diabetes mellitus with other specified complication: Secondary | ICD-10-CM | POA: Diagnosis not present

## 2020-06-27 DIAGNOSIS — R000 Tachycardia, unspecified: Secondary | ICD-10-CM | POA: Insufficient documentation

## 2020-06-27 DIAGNOSIS — I152 Hypertension secondary to endocrine disorders: Secondary | ICD-10-CM

## 2020-06-27 DIAGNOSIS — E669 Obesity, unspecified: Secondary | ICD-10-CM

## 2020-06-27 DIAGNOSIS — E1159 Type 2 diabetes mellitus with other circulatory complications: Secondary | ICD-10-CM

## 2020-06-27 DIAGNOSIS — E119 Type 2 diabetes mellitus without complications: Secondary | ICD-10-CM | POA: Insufficient documentation

## 2020-06-27 NOTE — Patient Instructions (Signed)
Medication Instructions:  Your physician recommends that you continue on your current medications as directed. Please refer to the Current Medication list given to you today.  *If you need a refill on your cardiac medications before your next appointment, please call your pharmacy*   Lab Work: NONE If you have labs (blood work) drawn today and your tests are completely normal, you will receive your results only by: . MyChart Message (if you have MyChart) OR . A paper copy in the mail If you have any lab test that is abnormal or we need to change your treatment, we will call you to review the results.   Testing/Procedures: NONE   Follow-Up: Follow up as needed At [facility], you and your health needs are our priority.  As part of our continuing mission to provide you with exceptional heart care, we have created designated Provider Care Teams.  These Care Teams include your primary Cardiologist (physician) and Advanced Practice Providers (APPs -  Physician Assistants and Nurse Practitioners) who all work together to provide you with the care you need, when you need it.  We recommend signing up for the patient portal called "MyChart".  Sign up information is provided on this After Visit Summary.  MyChart is used to connect with patients for Virtual Visits (Telemedicine).  Patients are able to view lab/test results, encounter notes, upcoming appointments, etc.  Non-urgent messages can be sent to your provider as well.   To learn more about what you can do with MyChart, go to [email or web address].    

## 2020-06-27 NOTE — Progress Notes (Signed)
Cardiology Office Note:    Date:  06/27/2020   ID:  Thomas Walters, DOB [birth year], MRN [id number]  PCP:  Lajean Manes, MD  [facility] Cardiologist:  Rudean Haskell MD (new) [place] Electrophysiologist:  None   CC: Eval for AF Consulted for the evaluation of atrial flutter at the behest of Lajean Manes, MD  History of Present Illness:    Thomas Walters is a 60 y.o. male with a hx of Morbid Obesity DMT with HTN, HLD, question of New Atrial fultter with variable block, who presented for evaluation 06/27/20.  Patient notes that he is feeling fine. Was found to have an elevated pulse and got and EKG:  EKG read as AFl.   Has had no chest pain, chest pressure, chest tightness, chest stinging.  Notes that he has some anxiety both in general and at this visit.    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  No shortness of breath, DOE.  No PND or orthopnea.  No bendopnea, weight gain, leg swelling , or abdominal swelling.  No syncope or near syncope . Notes no palpitations or funny heart beats.     Patient reports NO prior cardiac testing including  echo,  stress test,  heart catheterizations,  cardioversion,  ablations.  Ambulatory SBP 138 on new medications.   Past Medical History:  Diagnosis Date  . Anxiety   . Arthritis   . Asthma   . Cancer ([place])    Skin cancer- basal- forearm, upper chest  . GERD (gastroesophageal reflux disease)   . Hypertension   . Seasonal allergies   . Seizures ([place])    non since 1999, takes lamictal  . Shortness of breath dyspnea    with exertion, weather changes    Past Surgical History:  Procedure Laterality Date  . CARPAL TUNNEL RELEASE Right   . COLONOSCOPY W/ POLYPECTOMY    . HARDWARE REMOVAL Left 02/13/2015   Procedure: HARDWARE REMOVAL LEFT ANKLE;  Surgeon: Leandrew Koyanagi, MD;  Location: [facility];  Service: Orthopedics;   Laterality: Left;  . ORIF ANKLE FRACTURE Left 08/13/2014   Procedure: OPEN REDUCTION INTERNAL FIXATION (ORIF) LEFT ANKLE FRACTURE;  Surgeon: Leandrew Koyanagi, MD;  Location: [place];  Service: Orthopedics;  Laterality: Left;    Current Medications: Current Meds  Medication Sig  . aspirin 81 MG tablet Take 81 mg by mouth daily.  Marland Kitchen atorvastatin (LIPITOR) 10 MG tablet Take 10 mg by mouth daily.  . chlorthalidone (HYGROTON) 25 MG tablet Take 25 mg by mouth daily.  . clonazePAM (KLONOPIN) 1 MG tablet Take 1 mg by mouth at bedtime.  Marland Kitchen diltiazem (CARDIZEM CD) 120 MG 24 hr capsule Take 120 mg by mouth daily.  Marland Kitchen ibuprofen (ADVIL,MOTRIN) 200 MG tablet Take 200 mg by mouth every 6 (six) hours as needed (pain.).  Marland Kitchen losartan (COZAAR) 100 MG tablet Take 100 mg by mouth daily.  . metFORMIN (GLUCOPHAGE) 1000 MG tablet Take 1,000 mg by mouth 2 (two) times daily.  Marland Kitchen PROAIR HFA 108 (90 BASE) MCG/ACT inhaler Take 1 puff by mouth 2 (two) times daily as needed for wheezing or shortness of breath.   . traZODone (DESYREL) 150 MG tablet Take 225 mg by mouth at bedtime.     Allergies:   Prednisone   Social History   Socioeconomic History  . Marital status: Single    Spouse name: Not on file  .  Number of children: Not on file  . Years of education: Not on file  . Highest education level: Not on file  Occupational History  . Not on file  Tobacco Use  . Smoking status: Never Smoker  . Smokeless tobacco: Never Used  Substance and Sexual Activity  . Alcohol use: Yes    Alcohol/week: 35.0 standard drinks    Types: 35 Cans of beer per week    Comment: beer daily 2  . Drug use: Yes    Types: Marijuana    Comment: " last time 08/08/14  . Sexual activity: Not on file  Other Topics Concern  . Not on file  Social History Narrative  . Not on file   Social Determinants of Health   Financial Resource Strain: Not on file  Food Insecurity: Not on file  Transportation Needs: Not on file  Physical Activity: Not on  file  Stress: Not on file  Social Connections: Not on file     Family History: History of coronary artery disease notable for no members. History of heart failure notable for no members. History of arrhythmia notable for no members. Denies family history of sudden cardiac death including drowning, car accidents, or unexplained deaths in the family. No history of bicuspid aortic valve or aortic aneurysm or dissection.  ROS:   Please see the history of present illness.    All other systems reviewed and are negative.  EKGs/Labs/Other Studies Reviewed:    The following studies were reviewed today:  EKG:   06/27/20: SR 99 WNL 05/29/2020:  Most consistent with Sinus tachycardia rate 106 with non-specific TWI  Recent Labs: No results found for requested labs within last 8760 hours.  Recent Lipid Panel No results found for: CHOL, TRIG, HDL, CHOLHDL, VLDL, LDLCALC, LDLDIRECT  Outside Hospital  or Outside Clinic Studies (OSH):  Date: 05/29/20 Creatinine 0.70 TSH 1.06 BNP NA   Risk Assessment/Calculations:    CHA2DS2-VASc Score = 2  This indicates a 2.2% annual risk of stroke. The patient's score is based upon: CHF History: No HTN History: Yes Diabetes History: Yes Stroke History: No Vascular Disease History: No Age Score: 0 Gender Score: 0      Physical Exam:    VS:  BP (!) 168/96   Pulse (!) 105   Ht 5\' 6"  (1.676 m)   Wt 241 lb 12.8 oz (109.7 kg)   SpO2 96%   BMI 39.03 kg/m     HR rate   Wt Readings from Last 3 Encounters:  06/27/20 241 lb 12.8 oz (109.7 kg)  02/13/15 243 lb (110.2 kg)  08/13/14 240 lb (108.9 kg)    GEN:  Well nourished, well developed in no acute distress HEENT: Normal NECK: No JVD; No carotid bruits LYMPHATICS: No lymphadenopathy CARDIAC: Regular tachycardia, no murmurs, rubs, gallops RESPIRATORY:  Clear to auscultation without rales, wheezing or rhonchi  ABDOMEN: Soft, non-tender, non-distended MUSCULOSKELETAL:  No edema; No deformity   SKIN: Warm and dry NEUROLOGIC:  Alert and oriented x 3 PSYCHIATRIC:  Normal affect   ASSESSMENT:    1. Sinus tachycardia   2. Morbid obesity ([place])   3. Obesity, diabetes, and hypertension syndrome ([place])    PLAN:    In order of problems listed above:  Likely Sinus tachycardia - Given that the most common missed rhythm for sinus tachycardia is atrial flutter; we offered to do a heart monitor to see if he had Atrial flutter; patient deferred in the setting of cost.  We further discussed  the risks and benefits of not monitoring (missing anticoagulation or inappropriate AC in the setting of a high risk profession) but patient would prefer to not do testing in the setting of high deductible - If patient is still at SR rate 105 in follow up would benefit from heart monitoring  Morbid Obesity/DM/HTN - ambulatory blood pressure SBP 130, will continue ambulatory BP monitoring; gave education on how to perform ambulatory blood pressure monitoring including the frequency and technique; goal ambulatory blood pressure < 135/85 on average - continue home medications  - discussed diet (DASH/low sodium), and exercise/weight loss interventions   Hyperlipidemia (mixed) -LDL goal less than 100 -continue current statin - will need 3 month follow up after recent start - gave education on dietary changes  PRN follow up unless new symptoms or abnormal test results warranting change in plan; of if financial priorities change         Medication Adjustments/Labs and Tests Ordered: Current medicines are reviewed at length with the patient today.  Concerns regarding medicines are outlined above.  Orders Placed This Encounter  Procedures  . EKG 12-Lead   No orders of the defined types were placed in this encounter.   Patient Instructions  Medication Instructions:  Your physician recommends that you continue on your current medications as directed. Please refer to the Current Medication list given  to you today.  *If you need a refill on your cardiac medications before your next appointment, please call your pharmacy*   Lab Work: NONE If you have labs (blood work) drawn today and your tests are completely normal, you will receive your results only by: Marland Kitchen MyChart Message (if you have MyChart) OR . A paper copy in the mail If you have any lab test that is abnormal or we need to change your treatment, we will call you to review the results.   Testing/Procedures: NONE   Follow-Up:  Follow up as needed.  At [facility], you and your health needs are our priority.  As part of our continuing mission to provide you with exceptional heart care, we have created designated Provider Care Teams.  These Care Teams include your primary Cardiologist (physician) and Advanced Practice Providers (APPs -  Physician Assistants and Nurse Practitioners) who all work together to provide you with the care you need, when you need it.  We recommend signing up for the patient portal called "MyChart".  Sign up information is provided on this After Visit Summary.  MyChart is used to connect with patients for Virtual Visits (Telemedicine).  Patients are able to view lab/test results, encounter notes, upcoming appointments, etc.  Non-urgent messages can be sent to your provider as well.   To learn more about what you can do with MyChart, go to [email or web address].         Signed, Werner Lean, MD  06/27/2020 2:18 PM    [place]

## 2020-08-05 DIAGNOSIS — E1169 Type 2 diabetes mellitus with other specified complication: Secondary | ICD-10-CM | POA: Diagnosis not present

## 2020-08-05 DIAGNOSIS — Z23 Encounter for immunization: Secondary | ICD-10-CM | POA: Diagnosis not present

## 2020-08-05 DIAGNOSIS — Z0000 Encounter for general adult medical examination without abnormal findings: Secondary | ICD-10-CM | POA: Diagnosis not present

## 2020-08-05 DIAGNOSIS — Z125 Encounter for screening for malignant neoplasm of prostate: Secondary | ICD-10-CM | POA: Diagnosis not present

## 2020-08-05 DIAGNOSIS — E7800 Pure hypercholesterolemia, unspecified: Secondary | ICD-10-CM | POA: Diagnosis not present

## 2020-08-05 DIAGNOSIS — Z79899 Other long term (current) drug therapy: Secondary | ICD-10-CM | POA: Diagnosis not present

## 2021-02-03 DIAGNOSIS — I10 Essential (primary) hypertension: Secondary | ICD-10-CM | POA: Diagnosis not present

## 2021-02-03 DIAGNOSIS — E1169 Type 2 diabetes mellitus with other specified complication: Secondary | ICD-10-CM | POA: Diagnosis not present

## 2021-02-03 DIAGNOSIS — Z79899 Other long term (current) drug therapy: Secondary | ICD-10-CM | POA: Diagnosis not present

## 2021-02-03 DIAGNOSIS — F411 Generalized anxiety disorder: Secondary | ICD-10-CM | POA: Diagnosis not present

## 2021-02-03 DIAGNOSIS — Z23 Encounter for immunization: Secondary | ICD-10-CM | POA: Diagnosis not present

## 2021-06-02 ENCOUNTER — Ambulatory Visit
Admission: RE | Admit: 2021-06-02 | Discharge: 2021-06-02 | Disposition: A | Discharge status: Home / Self Care | Payer: BC Managed Care – PPO | Source: Ambulatory Visit | Attending: Internal Medicine | Admitting: Internal Medicine

## 2021-06-02 ENCOUNTER — Other Ambulatory Visit: Payer: Self-pay | Admitting: Internal Medicine

## 2021-06-02 DIAGNOSIS — M25511 Pain in right shoulder: Secondary | ICD-10-CM

## 2021-06-02 DIAGNOSIS — M19011 Primary osteoarthritis, right shoulder: Secondary | ICD-10-CM | POA: Diagnosis not present

## 2021-07-08 ENCOUNTER — Encounter: Payer: Self-pay | Admitting: Orthopaedic Surgery

## 2021-07-08 ENCOUNTER — Other Ambulatory Visit: Payer: Self-pay

## 2021-07-08 ENCOUNTER — Ambulatory Visit: Payer: BC Managed Care – PPO | Admitting: Orthopaedic Surgery

## 2021-07-08 DIAGNOSIS — M25511 Pain in right shoulder: Secondary | ICD-10-CM

## 2021-07-08 DIAGNOSIS — G8929 Other chronic pain: Secondary | ICD-10-CM | POA: Diagnosis not present

## 2021-07-08 MED ORDER — DICLOFENAC SODIUM 75 MG PO TBEC: 75.0000 mg | DELAYED_RELEASE_TABLET | Freq: Two times a day (BID) | ORAL | 2 refills | Status: DC | PRN

## 2021-07-08 MED ORDER — TRAMADOL HCL 50 MG PO TABS: 50.0000 mg | ORAL_TABLET | Freq: Two times a day (BID) | ORAL | 2 refills | Status: AC | PRN

## 2021-07-08 NOTE — Progress Notes (Signed)
Office Visit Note   Patient: Thomas Walters           Date of Birth: 13-Dec-1960           MRN: [id number] Visit Date: 07/08/2021              Requested by: Lajean Manes, MD [address] [place],   [address] PCP: Lajean Manes, MD   Assessment & Plan: Visit Diagnoses:  1. Chronic right shoulder pain     Plan: Impression is right shoulder subacromial bursitis and rotator cuff tendinitis.  I have discussed proceeding with subacromial cortisone injection today followed by [place] exercise program.  He is agreeable to this plan.  Also discussed that an MRI may be indicated if his symptoms do not improve over the next several weeks.  He notes that he is unable to afford this and will likely not proceed if needed.  He will follow-up with [place] as needed.  Follow-Up Instructions: Return if symptoms worsen or fail to improve.   Orders:  Orders Placed This Encounter  Procedures   Large Joint Inj: R subacromial bursa   Meds ordered this encounter  Medications   diclofenac (VOLTAREN) 75 MG EC tablet    Sig: Take 1 tablet (75 mg total) by mouth 2 (two) times daily as needed.    Dispense:  60 tablet    Refill:  2   traMADol (ULTRAM) 50 MG tablet    Sig: Take 1 tablet (50 mg total) by mouth every 12 (twelve) hours as needed.    Dispense:  30 tablet    Refill:  2      Procedures: Large Joint Inj: R subacromial bursa on 07/08/2021 11:28 AM Indications: pain Details: 22 G needle Medications: 2 mL lidocaine 2 %; 2 mL bupivacaine 0.25 %; 40 mg methylPREDNISolone acetate 40 MG/ML Outcome: tolerated well, no immediate complications Patient was prepped and draped in the usual sterile fashion.      Clinical Data: No additional findings.   Subjective: Chief Complaint  Patient presents with   Right Shoulder - Pain    HPI patient is a pleasant 61 year old gentleman who comes in today with right shoulder pain.  He sustained a fall about a month ago while chasing  his dog.  He thinks he landed on the side of the shoulder.  He has had pain at the top and posterior aspect of the shoulder since but has noticed that his range of motion and pain have both slightly improved.  He continues to have pain and slight limitations with forward flexion and internal rotation.  He denies any significant weakness.  He has been taking NSAIDs without significant relief.  No previous cortisone injection.  Review of Systems as detailed in HPI.  All others reviewed and are negative.   Objective: Vital Signs: There were no vitals taken for this visit.  Physical Exam well-developed well-nourished gentleman in no acute distress.  Alert and oriented x3.  Ortho Exam right shoulder exam shows forward flexion to approximately 160 degrees.  He does have pain with this.  He can internally rotate to L5.  Markedly positive empty can test.  Positive cross body adduction test.  Negative speeds and O'Brien's.  4 out of 5 strength throughout.  He is neurovascular intact distally.  Specialty Comments:  No specialty comments available.  Imaging: X-rays reviewed by me in [place] showed degenerative changes AC joint.  No acute fracture noted.   PMFS History: Patient Active Problem  List   Diagnosis Date Noted   Sinus tachycardia 06/27/2020   Morbid obesity ([place]) 06/27/2020   Obesity, diabetes, and hypertension syndrome ([place]) 06/27/2020   Past Medical History:  Diagnosis Date   Anxiety    Arthritis    Asthma    Cancer ([place])    Skin cancer- basal- forearm, upper chest   GERD (gastroesophageal reflux disease)    Hypertension    Seasonal allergies    Seizures ([place])    non since 1999, takes lamictal   Shortness of breath dyspnea    with exertion, weather changes    History reviewed. No pertinent family history.  Past Surgical History:  Procedure Laterality Date   CARPAL TUNNEL RELEASE Right    COLONOSCOPY W/ POLYPECTOMY     HARDWARE REMOVAL Left 02/13/2015   Procedure: HARDWARE  REMOVAL LEFT ANKLE;  Surgeon: Leandrew Koyanagi, MD;  Location: [place];  Service: Orthopedics;  Laterality: Left;   ORIF ANKLE FRACTURE Left 08/13/2014   Procedure: OPEN REDUCTION INTERNAL FIXATION (ORIF) LEFT ANKLE FRACTURE;  Surgeon: Leandrew Koyanagi, MD;  Location: [place];  Service: Orthopedics;  Laterality: Left;   Social History   Occupational History   Not on file  Tobacco Use   Smoking status: Never   Smokeless tobacco: Never  Substance and Sexual Activity   Alcohol use: Yes    Alcohol/week: 35.0 standard drinks    Types: 35 Cans of beer per week    Comment: beer daily 2   Drug use: Yes    Types: Marijuana    Comment: " last time 08/08/14   Sexual activity: Not on file

## 2021-07-10 MED ORDER — METHYLPREDNISOLONE ACETATE 40 MG/ML IJ SUSP
40.0000 mg | INTRAMUSCULAR | Status: AC | PRN
  Administered 2021-07-08: 40 mg via INTRA_ARTICULAR

## 2021-07-10 MED ORDER — BUPIVACAINE HCL 0.25 % IJ SOLN
2.0000 mL | INTRAMUSCULAR | Status: AC | PRN
  Administered 2021-07-08: 2 mL via INTRA_ARTICULAR

## 2021-07-10 MED ORDER — LIDOCAINE HCL 2 % IJ SOLN
2.0000 mL | INTRAMUSCULAR | Status: AC | PRN
  Administered 2021-07-08: 2 mL

## 2021-08-18 DIAGNOSIS — Z79899 Other long term (current) drug therapy: Secondary | ICD-10-CM | POA: Diagnosis not present

## 2021-08-18 DIAGNOSIS — E1169 Type 2 diabetes mellitus with other specified complication: Secondary | ICD-10-CM | POA: Diagnosis not present

## 2021-08-18 DIAGNOSIS — I10 Essential (primary) hypertension: Secondary | ICD-10-CM | POA: Diagnosis not present

## 2021-08-18 DIAGNOSIS — Z0000 Encounter for general adult medical examination without abnormal findings: Secondary | ICD-10-CM | POA: Diagnosis not present

## 2021-08-18 DIAGNOSIS — Z125 Encounter for screening for malignant neoplasm of prostate: Secondary | ICD-10-CM | POA: Diagnosis not present

## 2021-12-02 ENCOUNTER — Other Ambulatory Visit: Payer: Self-pay | Admitting: Physician Assistant

## 2021-12-08 ENCOUNTER — Telehealth: Payer: Self-pay | Admitting: Physician Assistant

## 2021-12-08 NOTE — Telephone Encounter (Signed)
Received call from [employer] loss adjuster) with [facility] advised patient asked for a refill on Diclofenac. Barnetta Chapel asked if the Rx can be filled if appropriate. The number to contact Barnetta Chapel is [phone]

## 2021-12-09 ENCOUNTER — Other Ambulatory Visit: Payer: Self-pay | Admitting: Physician Assistant

## 2021-12-09 MED ORDER — DICLOFENAC SODIUM 75 MG PO TBEC: 75.0000 mg | DELAYED_RELEASE_TABLET | Freq: Two times a day (BID) | ORAL | 2 refills | Status: AC | PRN

## 2021-12-09 NOTE — Telephone Encounter (Signed)
Sent in

## 2022-02-18 DIAGNOSIS — Z79899 Other long term (current) drug therapy: Secondary | ICD-10-CM | POA: Diagnosis not present

## 2022-02-18 DIAGNOSIS — Z23 Encounter for immunization: Secondary | ICD-10-CM | POA: Diagnosis not present

## 2022-02-18 DIAGNOSIS — E1169 Type 2 diabetes mellitus with other specified complication: Secondary | ICD-10-CM | POA: Diagnosis not present

## 2022-02-18 DIAGNOSIS — I10 Essential (primary) hypertension: Secondary | ICD-10-CM | POA: Diagnosis not present

## 2022-11-18 DIAGNOSIS — E1169 Type 2 diabetes mellitus with other specified complication: Secondary | ICD-10-CM | POA: Diagnosis not present

## 2022-11-18 DIAGNOSIS — I10 Essential (primary) hypertension: Secondary | ICD-10-CM | POA: Diagnosis not present

## 2022-11-18 DIAGNOSIS — Z0000 Encounter for general adult medical examination without abnormal findings: Secondary | ICD-10-CM | POA: Diagnosis not present

## 2022-11-18 DIAGNOSIS — Z79899 Other long term (current) drug therapy: Secondary | ICD-10-CM | POA: Diagnosis not present

## 2022-11-18 DIAGNOSIS — Z125 Encounter for screening for malignant neoplasm of prostate: Secondary | ICD-10-CM | POA: Diagnosis not present

## 2022-11-18 DIAGNOSIS — E7800 Pure hypercholesterolemia, unspecified: Secondary | ICD-10-CM | POA: Diagnosis not present

## 2023-02-01 DIAGNOSIS — J4530 Mild persistent asthma, uncomplicated: Secondary | ICD-10-CM | POA: Diagnosis not present

## 2023-02-01 DIAGNOSIS — I10 Essential (primary) hypertension: Secondary | ICD-10-CM | POA: Diagnosis not present

## 2023-05-31 DIAGNOSIS — E1169 Type 2 diabetes mellitus with other specified complication: Secondary | ICD-10-CM | POA: Diagnosis not present

## 2023-05-31 DIAGNOSIS — I10 Essential (primary) hypertension: Secondary | ICD-10-CM | POA: Diagnosis not present

## 2023-05-31 DIAGNOSIS — J4530 Mild persistent asthma, uncomplicated: Secondary | ICD-10-CM | POA: Diagnosis not present

## 2023-05-31 DIAGNOSIS — E7800 Pure hypercholesterolemia, unspecified: Secondary | ICD-10-CM | POA: Diagnosis not present

## 2023-11-01 DIAGNOSIS — E119 Type 2 diabetes mellitus without complications: Secondary | ICD-10-CM | POA: Diagnosis not present

## 2023-11-01 DIAGNOSIS — H5203 Hypermetropia, bilateral: Secondary | ICD-10-CM | POA: Diagnosis not present

## 2023-11-22 DIAGNOSIS — J4530 Mild persistent asthma, uncomplicated: Secondary | ICD-10-CM | POA: Diagnosis not present

## 2023-11-22 DIAGNOSIS — E7800 Pure hypercholesterolemia, unspecified: Secondary | ICD-10-CM | POA: Diagnosis not present

## 2023-11-22 DIAGNOSIS — Z79899 Other long term (current) drug therapy: Secondary | ICD-10-CM | POA: Diagnosis not present

## 2023-11-22 DIAGNOSIS — I10 Essential (primary) hypertension: Secondary | ICD-10-CM | POA: Diagnosis not present

## 2023-11-22 DIAGNOSIS — E1169 Type 2 diabetes mellitus with other specified complication: Secondary | ICD-10-CM | POA: Diagnosis not present

## 2023-11-22 DIAGNOSIS — Z125 Encounter for screening for malignant neoplasm of prostate: Secondary | ICD-10-CM | POA: Diagnosis not present

## 2023-11-23 ENCOUNTER — Other Ambulatory Visit (HOSPITAL_COMMUNITY): Payer: Self-pay

## 2023-11-23 MED ORDER — MOUNJARO 2.5 MG/0.5ML ~~LOC~~ SOAJ
2.5000 mg | SUBCUTANEOUS | 0 refills | Status: AC
  Filled 2023-11-23: qty 2, 28d supply, fill #0

## 2023-11-24 ENCOUNTER — Other Ambulatory Visit (HOSPITAL_COMMUNITY): Payer: Self-pay

## 2023-11-25 ENCOUNTER — Other Ambulatory Visit (HOSPITAL_COMMUNITY): Payer: Self-pay

## 2024-03-08 DIAGNOSIS — D1801 Hemangioma of skin and subcutaneous tissue: Secondary | ICD-10-CM | POA: Diagnosis not present

## 2024-03-08 DIAGNOSIS — L570 Actinic keratosis: Secondary | ICD-10-CM | POA: Diagnosis not present

## 2024-03-08 DIAGNOSIS — L814 Other melanin hyperpigmentation: Secondary | ICD-10-CM | POA: Diagnosis not present

## 2024-03-08 DIAGNOSIS — C44619 Basal cell carcinoma of skin of left upper limb, including shoulder: Secondary | ICD-10-CM | POA: Diagnosis not present

## 2024-05-08 DIAGNOSIS — E1169 Type 2 diabetes mellitus with other specified complication: Secondary | ICD-10-CM | POA: Diagnosis not present

## 2024-05-08 DIAGNOSIS — Z6838 Body mass index (BMI) 38.0-38.9, adult: Secondary | ICD-10-CM | POA: Diagnosis not present
